# Patient Record
Sex: Male | Born: 1975 | Race: White | Hispanic: No | Marital: Married | Smoking: Never smoker
Health system: Southern US, Community
[De-identification: ages and names within clinical notes are randomized; demographics above are authoritative.]

## PROBLEM LIST (undated history)

## (undated) DIAGNOSIS — S069X9A Unspecified intracranial injury with loss of consciousness of unspecified duration, initial encounter: Secondary | ICD-10-CM

## (undated) DIAGNOSIS — C801 Malignant (primary) neoplasm, unspecified: Secondary | ICD-10-CM

## (undated) DIAGNOSIS — H9190 Unspecified hearing loss, unspecified ear: Secondary | ICD-10-CM

## (undated) DIAGNOSIS — S069XAA Unspecified intracranial injury with loss of consciousness status unknown, initial encounter: Secondary | ICD-10-CM

---

## 2001-08-14 ENCOUNTER — Ambulatory Visit (HOSPITAL_COMMUNITY): Admission: RE | Admit: 2001-08-14 | Discharge: 2001-08-14 | Payer: Self-pay | Admitting: Orthopedic Surgery

## 2001-08-14 ENCOUNTER — Encounter: Payer: Self-pay | Admitting: Orthopedic Surgery

## 2005-02-27 ENCOUNTER — Emergency Department (HOSPITAL_COMMUNITY): Admission: EM | Admit: 2005-02-27 | Discharge: 2005-02-27 | Payer: Self-pay | Admitting: Emergency Medicine

## 2009-09-09 ENCOUNTER — Encounter: Admission: RE | Admit: 2009-09-09 | Discharge: 2009-09-09 | Payer: Self-pay | Admitting: Family Medicine

## 2011-05-22 ENCOUNTER — Other Ambulatory Visit: Payer: Self-pay | Admitting: Occupational Medicine

## 2011-05-22 ENCOUNTER — Ambulatory Visit
Admission: RE | Admit: 2011-05-22 | Discharge: 2011-05-22 | Disposition: A | Discharge status: Home / Self Care | Payer: Worker's Compensation | Source: Ambulatory Visit | Attending: Occupational Medicine | Admitting: Occupational Medicine

## 2011-05-22 DIAGNOSIS — T148XXA Other injury of unspecified body region, initial encounter: Secondary | ICD-10-CM

## 2012-02-13 ENCOUNTER — Ambulatory Visit
Admission: RE | Admit: 2012-02-13 | Discharge: 2012-02-13 | Disposition: A | Discharge status: Home / Self Care | Payer: 59 | Source: Ambulatory Visit | Attending: Family Medicine | Admitting: Family Medicine

## 2012-02-13 ENCOUNTER — Other Ambulatory Visit: Payer: Self-pay | Admitting: Family Medicine

## 2012-02-13 DIAGNOSIS — R52 Pain, unspecified: Secondary | ICD-10-CM

## 2013-10-18 ENCOUNTER — Encounter (HOSPITAL_COMMUNITY): Payer: Self-pay | Admitting: Emergency Medicine

## 2013-10-18 ENCOUNTER — Emergency Department (HOSPITAL_COMMUNITY)
Admission: EM | Admit: 2013-10-18 | Discharge: 2013-10-19 | Disposition: A | Discharge status: Home / Self Care | Payer: 59 | Attending: Emergency Medicine | Admitting: Emergency Medicine

## 2013-10-18 ENCOUNTER — Emergency Department (HOSPITAL_COMMUNITY): Payer: 59

## 2013-10-18 DIAGNOSIS — W540XXA Bitten by dog, initial encounter: Secondary | ICD-10-CM | POA: Insufficient documentation

## 2013-10-18 DIAGNOSIS — S61409A Unspecified open wound of unspecified hand, initial encounter: Secondary | ICD-10-CM | POA: Insufficient documentation

## 2013-10-18 DIAGNOSIS — Z8669 Personal history of other diseases of the nervous system and sense organs: Secondary | ICD-10-CM | POA: Insufficient documentation

## 2013-10-18 DIAGNOSIS — Y9389 Activity, other specified: Secondary | ICD-10-CM | POA: Insufficient documentation

## 2013-10-18 DIAGNOSIS — S51809A Unspecified open wound of unspecified forearm, initial encounter: Secondary | ICD-10-CM | POA: Insufficient documentation

## 2013-10-18 DIAGNOSIS — Y92009 Unspecified place in unspecified non-institutional (private) residence as the place of occurrence of the external cause: Secondary | ICD-10-CM | POA: Insufficient documentation

## 2013-10-18 HISTORY — DX: Unspecified hearing loss, unspecified ear: H91.90

## 2013-10-18 MED ORDER — FENTANYL CITRATE 0.05 MG/ML IJ SOLN
50.0000 ug | Freq: Once | INTRAMUSCULAR | Status: AC
  Administered 2013-10-18: 50 ug via INTRAVENOUS
  Filled 2013-10-18: qty 2
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
## 2013-10-18 NOTE — ED Notes (Signed)
Pt soaking hands and wrist at this time.  Family at bedside.  Family states they asked dog owners about shots and they were told shots were up to date on dog.  Dog was a great dane.

## 2013-10-18 NOTE — ED Notes (Signed)
Patient transported to X-ray 
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
## 2013-10-19 MED ORDER — ONDANSETRON HCL 4 MG/2ML IJ SOLN
4.0000 mg | Freq: Once | INTRAMUSCULAR | Status: AC
  Administered 2013-10-19: 4 mg via INTRAVENOUS
  Filled 2013-10-19: qty 2

## 2013-10-19 MED ORDER — AMOXICILLIN-POT CLAVULANATE 875-125 MG PO TABS: 1.0000 | ORAL_TABLET | Freq: Two times a day (BID) | ORAL | Status: DC

## 2013-10-19 MED ORDER — ONDANSETRON 8 MG PO TBDP: ORAL_TABLET | ORAL | Status: DC

## 2013-10-19 MED ORDER — HYDROCODONE-ACETAMINOPHEN 5-325 MG PO TABS: 1.0000 | ORAL_TABLET | ORAL | Status: DC | PRN

## 2013-10-19 NOTE — ED Notes (Signed)
Non-adherent dressings and gauze wrapping placed on pt after PA cleaned arms with iodine soap.

## 2013-10-19 NOTE — ED Provider Notes (Signed)
Medical screening examination/treatment/procedure(s) were performed by non-physician practitioner and as supervising physician I was immediately available for consultation/collaboration.   Dot Lanes, MD 10/19/13 1247

## 2013-10-19 NOTE — Discharge Instructions (Signed)
1. Medications: vicodin for pain, augmentin is your antibiotic, zofran for nausea, usual home medications 2. Treatment: rest, drink plenty of fluids, keep wound clean with warm soap and water; keep bandages dry 3. Follow Up: Please followup with Dr. Burney Gauze sometime next week for wound check and re-evaluation   Animal Bite An animal bite can result in a scratch on the skin, deep open cut, puncture of the skin, crush injury, or tearing away of the skin or a body part. Dogs are responsible for most animal bites. Children are bitten more often than adults. An animal bite can range from very mild to more serious. A small bite from your house pet is no cause for alarm. However, some animal bites can become infected or injure a bone or other tissue. You must seek medical care if:  The skin is broken and bleeding does not slow down or stop after 15 minutes.  The puncture is deep and difficult to clean (such as a cat bite).  Pain, warmth, redness, or pus develops around the wound.  The bite is from a stray animal or rodent. There may be a risk of rabies infection.  The bite is from a snake, raccoon, skunk, fox, coyote, or bat. There may be a risk of rabies infection.  The person bitten has a chronic illness such as diabetes, liver disease, or cancer, or the person takes medicine that lowers the immune system.  There is concern about the location and severity of the bite. It is important to clean and protect an animal bite wound right away to prevent infection. Follow these steps:  Clean the wound with plenty of water and soap.  Apply an antibiotic cream.  Apply gentle pressure over the wound with a clean towel or gauze to slow or stop bleeding.  Elevate the affected area above the heart to help stop any bleeding.  Seek medical care. Getting medical care within 8 hours of the animal bite leads to the best possible outcome. DIAGNOSIS  Your caregiver will most likely:  Take a detailed  history of the animal and the bite injury.  Perform a wound exam.  Take your medical history. Blood tests or X-rays may be performed. Sometimes, infected bite wounds are cultured and sent to a lab to identify the infectious bacteria.  TREATMENT  Medical treatment will depend on the location and type of animal bite as well as the patient's medical history. Treatment may include:  Wound care, such as cleaning and flushing the wound with saline solution, bandaging, and elevating the affected area.  Antibiotics.  Tetanus immunization.  Rabies immunization.  Leaving the wound open to heal. This is often done with animal bites, due to the high risk of infection. However, in certain cases, wound closure with stitches, wound adhesive, skin adhesive strips, or staples may be used. Infected bites that are left untreated may require intravenous (IV) antibiotics and surgical treatment in the hospital. [place]  Follow your caregiver's instructions for wound care.  Take all medicines as directed.  If your caregiver prescribes antibiotics, take them as directed. Finish them even if you start to feel better.  Follow up with your caregiver for further exams or immunizations as directed. You may need a tetanus shot if:  You cannot remember when you had your last tetanus shot.  You have never had a tetanus shot.  The injury broke your skin. If you get a tetanus shot, your arm may swell, get red, and feel warm to the  touch. This is common and not a problem. If you need a tetanus shot and you choose not to have one, there is a rare chance of getting tetanus. Sickness from tetanus can be serious. SEEK MEDICAL CARE IF:  You notice warmth, redness, soreness, swelling, pus discharge, or a bad smell coming from the wound.  You have a red line on the skin coming from the wound.  You have a fever, chills, or a general ill feeling.  You have nausea or vomiting.  You have continued or  worsening pain.  You have trouble moving the injured part.  You have other questions or concerns. MAKE SURE YOU:  Understand these instructions.  Will watch your condition.  Will get help right away if you are not doing well or get worse. Document Released: 03/28/2011 Document Revised: 10/02/2011 Document Reviewed: 03/28/2011 [facility] Patient Information 2014 [place].

## 2014-07-24 DIAGNOSIS — C801 Malignant (primary) neoplasm, unspecified: Secondary | ICD-10-CM

## 2014-07-24 HISTORY — DX: Malignant (primary) neoplasm, unspecified: C80.1

## 2014-07-24 HISTORY — PX: TUMOR REMOVAL: SHX12

## 2014-10-15 ENCOUNTER — Encounter (HOSPITAL_COMMUNITY): Payer: Self-pay | Admitting: Emergency Medicine

## 2014-10-15 ENCOUNTER — Emergency Department (HOSPITAL_COMMUNITY)
Admission: EM | Admit: 2014-10-15 | Discharge: 2014-10-15 | Disposition: A | Discharge status: Home / Self Care | Payer: 59 | Attending: Emergency Medicine | Admitting: Emergency Medicine

## 2014-10-15 ENCOUNTER — Emergency Department (HOSPITAL_COMMUNITY): Payer: 59

## 2014-10-15 DIAGNOSIS — Z79899 Other long term (current) drug therapy: Secondary | ICD-10-CM | POA: Diagnosis not present

## 2014-10-15 DIAGNOSIS — N12 Tubulo-interstitial nephritis, not specified as acute or chronic: Secondary | ICD-10-CM | POA: Diagnosis not present

## 2014-10-15 DIAGNOSIS — H9190 Unspecified hearing loss, unspecified ear: Secondary | ICD-10-CM | POA: Diagnosis not present

## 2014-10-15 DIAGNOSIS — R5383 Other fatigue: Secondary | ICD-10-CM | POA: Insufficient documentation

## 2014-10-15 DIAGNOSIS — M549 Dorsalgia, unspecified: Secondary | ICD-10-CM | POA: Diagnosis present

## 2014-10-15 LAB — URINE MICROSCOPIC-ADD ON

## 2014-10-15 LAB — CBC WITH DIFFERENTIAL/PLATELET
BASOS ABS: 0 10*3/uL (ref 0.0–0.1)
BASOS PCT: 0 % (ref 0–1)
EOS PCT: 0 % (ref 0–5)
Eosinophils Absolute: 0 10*3/uL (ref 0.0–0.7)
HCT: 41.1 % (ref 39.0–52.0)
Hemoglobin: 13.9 g/dL (ref 13.0–17.0)
LYMPHS ABS: 0.5 10*3/uL — AB (ref 0.7–4.0)
Lymphocytes Relative: 4 % — ABNORMAL LOW (ref 12–46)
MCH: 33.5 pg (ref 26.0–34.0)
MCHC: 33.8 g/dL (ref 30.0–36.0)
MCV: 99 fL (ref 78.0–100.0)
MONO ABS: 1.3 10*3/uL — AB (ref 0.1–1.0)
MONOS PCT: 13 % — AB (ref 3–12)
NEUTROS PCT: 83 % — AB (ref 43–77)
Neutro Abs: 8.4 10*3/uL — ABNORMAL HIGH (ref 1.7–7.7)
PLATELETS: 69 10*3/uL — AB (ref 150–400)
RBC: 4.15 MIL/uL — ABNORMAL LOW (ref 4.22–5.81)
RDW: 12 % (ref 11.5–15.5)
WBC: 10.1 10*3/uL (ref 4.0–10.5)

## 2014-10-15 LAB — COMPREHENSIVE METABOLIC PANEL
ALBUMIN: 3.4 g/dL — AB (ref 3.5–5.2)
ALK PHOS: 54 U/L (ref 39–117)
ALT: 26 U/L (ref 0–53)
ANION GAP: 11 (ref 5–15)
AST: 34 U/L (ref 0–37)
BILIRUBIN TOTAL: 0.6 mg/dL (ref 0.3–1.2)
BUN: 27 mg/dL — AB (ref 6–23)
CALCIUM: 8.7 mg/dL (ref 8.4–10.5)
CHLORIDE: 96 mmol/L (ref 96–112)
CO2: 27 mmol/L (ref 19–32)
CREATININE: 2.03 mg/dL — AB (ref 0.50–1.35)
GFR calc Af Amer: 47 mL/min — ABNORMAL LOW (ref >90)
GFR calc non Af Amer: 40 mL/min — ABNORMAL LOW (ref >90)
Glucose, Bld: 151 mg/dL — ABNORMAL HIGH (ref 70–99)
Potassium: 3.6 mmol/L (ref 3.5–5.1)
Sodium: 134 mmol/L — ABNORMAL LOW (ref 135–145)
Total Protein: 7.2 g/dL (ref 6.0–8.3)

## 2014-10-15 LAB — URINALYSIS, ROUTINE W REFLEX MICROSCOPIC
Bilirubin Urine: NEGATIVE
Glucose, UA: NEGATIVE mg/dL
Ketones, ur: NEGATIVE mg/dL
Nitrite: NEGATIVE
Protein, ur: 100 mg/dL — AB
Specific Gravity, Urine: 1.019 (ref 1.005–1.030)
Urobilinogen, UA: 1 mg/dL (ref 0.0–1.0)
pH: 6 (ref 5.0–8.0)

## 2014-10-15 LAB — I-STAT CG4 LACTIC ACID, ED: LACTIC ACID, VENOUS: 1.59 mmol/L (ref 0.5–2.0)

## 2014-10-15 MED ORDER — CIPROFLOXACIN HCL 500 MG PO TABS: 500.0000 mg | ORAL_TABLET | Freq: Two times a day (BID) | ORAL | Status: DC

## 2014-10-15 MED ORDER — OXYCODONE-ACETAMINOPHEN 5-325 MG PO TABS
2.0000 | ORAL_TABLET | Freq: Once | ORAL | Status: AC
  Administered 2014-10-15: 2 via ORAL
  Filled 2014-10-15: qty 2

## 2014-10-15 MED ORDER — DEXTROSE 5 % IV SOLN
1.0000 g | Freq: Once | INTRAVENOUS | Status: AC
  Administered 2014-10-15: 1 g via INTRAVENOUS
  Filled 2014-10-15: qty 10

## 2014-10-15 MED ORDER — ACETAMINOPHEN 325 MG PO TABS
650.0000 mg | ORAL_TABLET | Freq: Four times a day (QID) | ORAL | Status: DC | PRN
  Administered 2014-10-15: 650 mg via ORAL
  Filled 2014-10-15: qty 2

## 2014-10-15 NOTE — ED Notes (Signed)
Post void residual-629cc. MD made aware

## 2014-10-15 NOTE — ED Notes (Signed)
Leg bag applied to patient, foley care and education completed with wife at bedside, follow up with urology reviewed with PA at bedside.  Verbalized understanding.

## 2014-10-15 NOTE — ED Notes (Signed)
From PCP, fever, confusion, HA since mon, no V/D, also c/o cough, fever, tachy in triage, abd pain, difficulty walking

## 2014-10-15 NOTE — Discharge Instructions (Signed)
Urinary Tract Infection Urinary tract infections (UTIs) can develop anywhere along your urinary tract. Your urinary tract is your body's drainage system for removing wastes and extra water. Your urinary tract includes two kidneys, two ureters, a bladder, and a urethra. Your kidneys are a pair of bean-shaped organs. Each kidney is about the size of your fist. They are located below your ribs, one on each side of your spine. CAUSES Infections are caused by microbes, which are microscopic organisms, including fungi, viruses, and bacteria. These organisms are so small that they can only be seen through a microscope. Bacteria are the microbes that most commonly cause UTIs. SYMPTOMS  Symptoms of UTIs may vary by age and gender of the patient and by the location of the infection. Symptoms in young women typically include a frequent and intense urge to urinate and a painful, burning feeling in the bladder or urethra during urination. Older women and men are more likely to be tired, shaky, and weak and have muscle aches and abdominal pain. A fever may mean the infection is in your kidneys. Other symptoms of a kidney infection include pain in your back or sides below the ribs, nausea, and vomiting. DIAGNOSIS To diagnose a UTI, your caregiver will ask you about your symptoms. Your caregiver also will ask to provide a urine sample. The urine sample will be tested for bacteria and white blood cells. White blood cells are made by your body to help fight infection. TREATMENT  Typically, UTIs can be treated with medication. Because most UTIs are caused by a bacterial infection, they usually can be treated with the use of antibiotics. The choice of antibiotic and length of treatment depend on your symptoms and the type of bacteria causing your infection. HOME CARE INSTRUCTIONS  If you were prescribed antibiotics, take them exactly as your caregiver instructs you. Finish the medication even if you feel better after you  have only taken some of the medication.  Drink enough water and fluids to keep your urine clear or pale yellow.  Avoid caffeine, tea, and carbonated beverages. They tend to irritate your bladder.  Empty your bladder often. Avoid holding urine for long periods of time.  Empty your bladder before and after sexual intercourse.  After a bowel movement, women should cleanse from front to back. Use each tissue only once. SEEK MEDICAL CARE IF:   You have back pain.  You develop a fever.  Your symptoms do not begin to resolve within 3 days. SEEK IMMEDIATE MEDICAL CARE IF:   You have severe back pain or lower abdominal pain.  You develop chills.  You have nausea or vomiting.  You have continued burning or discomfort with urination. MAKE SURE YOU:   Understand these instructions.  Will watch your condition.  Will get help right away if you are not doing well or get worse. Document Released: 04/19/2005 Document Revised: 01/09/2012 Document Reviewed: 08/18/2011 [facility] Patient Information 2015 [place], [place]. This information is not intended to replace advice given to you by your health care provider. Make sure you discuss any questions you have with your health care provider.  Please take medication as directed for the full course of therapy. Please follow-up with [facility] tomorrow for further evaluation and management. Please inform them of your emergency room visit and THERAPIES received here. If symptoms worsen including increased fever, fever uncontrolled by medication, nausea, vomiting, increased pain, dizziness, change in urine, or any other concerning signs or symptoms please return immediately to the emergency room  for further evaluation and management.

## 2014-10-15 NOTE — ED Provider Notes (Signed)
CSN: [id number]     Arrival date & time 10/15/14  1200 History   First MD Initiated Contact with Patient 10/15/14 1305     Chief Complaint  Patient presents with  . Fever  . Headache    HPI  39 year old male presents today with 4 days of fever, fatigue, back pain. She reports he was seen by his primary care provider today at [facility] who requested he be seen in the emergency room for potential urinary tract infection. Patient reports that he was seen in February for urinary retention and found to have an enlarged prostate. He was not treated for prostatitis at that time and was scheduled a follow-up appointment with urologist in April. Given doxazosin reports he has not been taking it. He reports frequent urinations, little production, suprapubic fullness. He denies blood, discharge, painful urinations. He reports nausea, no vomiting. Mild frontal headache from time to time, no associated neuro symptoms. Patient reports he's been treating his fever with Tylenol, MAXIMUM TEMPERATURE 104.1. Patient denies neck stiffness, chest pain, shortness of breath, vomiting, lower extremity swelling. Patient reports his urological follow-up is April 1 with [facility] with Dr. Tresa Moore.    Past Medical History  Diagnosis Date  . Hard of hearing    History reviewed. No pertinent past surgical history. No family history on file. History  Substance Use Topics  . Smoking status: Never Smoker   . Smokeless tobacco: Not on file  . Alcohol Use: No    Review of Systems  All other systems reviewed and are negative.  Allergies  Phenobarbital and Fish allergy  Home Medications   Prior to Admission medications   Medication Sig Start Date End Date Taking? Authorizing Provider  amoxicillin-clavulanate (AUGMENTIN) 875-125 MG per tablet Take 1 tablet by mouth 2 (two) times daily. One po bid x 7 days 10/19/13   Jarrett Soho Muthersbaugh, PA-C  HYDROcodone-acetaminophen (NORCO/VICODIN) 5-325 MG per tablet  Take 1 tablet by mouth every 4 (four) hours as needed. 10/19/13   Hannah Muthersbaugh, PA-C  ondansetron (ZOFRAN ODT) 8 MG disintegrating tablet 8mg  ODT q4 hours prn nausea 10/19/13   Hannah Muthersbaugh, PA-C   BP 110/68 mmHg  Pulse 78  Temp(Src) 100.1 F (37.8 C) (Oral)  Resp 16  Ht 6\' 3"  (1.905 m)  Wt 181 lb (82.101 kg)  BMI 22.62 kg/m2  SpO2 98% Physical Exam  Constitutional: He is oriented to person, place, and time. He appears well-developed and well-nourished.  HENT:  Head: Normocephalic and atraumatic.  Eyes: Pupils are equal, round, and reactive to light.  Neck: Normal range of motion. Neck supple. No JVD present. No tracheal deviation present. No thyromegaly present.  Cardiovascular: Normal rate, regular rhythm, normal heart sounds and intact distal pulses.  Exam reveals no gallop and no friction rub.   No murmur heard. Pulmonary/Chest: Effort normal and breath sounds normal. No stridor. No respiratory distress. He has no wheezes. He has no rales. He exhibits no tenderness.  Abdominal: Soft. Bowel sounds are normal. He exhibits no distension and no mass. There is CVA tenderness. There is no rebound and no guarding.  Suprapubic tenderness, right CVA tenderness  Musculoskeletal: Normal range of motion.  Lymphadenopathy:    He has no cervical adenopathy.  Neurological: He is alert and oriented to person, place, and time. Coordination normal.  Skin: Skin is warm and dry.  Psychiatric: He has a normal mood and affect. His behavior is normal. Judgment and thought content normal.  Nursing note and vitals reviewed.  ED Course  Procedures (including critical care time) Labs Review Labs Reviewed  CBC WITH DIFFERENTIAL/PLATELET - Abnormal; Notable for the following:    RBC 4.15 (*)    Platelets 69 (*)    Neutrophils Relative % 83 (*)    Neutro Abs 8.4 (*)    Lymphocytes Relative 4 (*)    Lymphs Abs 0.5 (*)    Monocytes Relative 13 (*)    Monocytes Absolute 1.3 (*)    All  other components within normal limits  COMPREHENSIVE METABOLIC PANEL - Abnormal; Notable for the following:    Sodium 134 (*)    Glucose, Bld 151 (*)    BUN 27 (*)    Creatinine, Ser 2.03 (*)    Albumin 3.4 (*)    GFR calc non Af Amer 40 (*)    GFR calc Af Amer 47 (*)    All other components within normal limits  URINALYSIS, ROUTINE W REFLEX MICROSCOPIC - Abnormal; Notable for the following:    APPearance TURBID (*)    Hgb urine dipstick LARGE (*)    Protein, ur 100 (*)    Leukocytes, UA LARGE (*)    All other components within normal limits  URINE MICROSCOPIC-ADD ON - Abnormal; Notable for the following:    Squamous Epithelial / LPF FEW (*)    Bacteria, UA MANY (*)    Casts GRANULAR CAST (*)    All other components within normal limits  I-STAT CG4 LACTIC ACID, ED    Imaging Review Dg Chest 2 View  10/15/2014   CLINICAL DATA:  Fever, headache cough, dizziness  EXAM: CHEST  2 VIEW  COMPARISON:  None.  FINDINGS: Cardiomediastinal silhouette is unremarkable. No acute infiltrate or pleural effusion. No pulmonary edema. Bony thorax is unremarkable.  IMPRESSION: No active cardiopulmonary disease.   Electronically Signed   By: Lahoma Crocker M.D.   On: 10/15/2014 13:50     EKG Interpretation None      MDM   Final diagnoses:  Pyelonephritis    Labs: Hgb large, protein 100, leukocytes large, many bacteria, granular casts; CBC platelets 69; CMP creatinine 2.03 BUN 27 GFR 40  Consults: Urology Herbert Moors MD  Therapeutics: Post void residual 629 mL, Tylenol, Percocet  Assessment/Plan: Patient's presentation most likely pyelonephritis with the possibility of prostatitis. Patient is stable with a temperature of 98.9, pulse of 89 and blood pressure 127/76. Patient reports improvement of symptoms with catheter placement, continues to report right flank pain. Patient's case was discussed with Herbert Moors M.D. Urology. He recommended in-hospital dose of antibiotics followed by  outpatient oral therapy; if patient agreed to outpatient treatment. Instructed for patient to contact clinic for follow-up instructions. Discussion with patient, wife, mother discussing benefits and risks of inpatient his outpatient treatment. All 3 agreed that he would have close supervision, with close follow-up. Patient was counseled on warning signs including increased fever, nausea, vomiting, abdominal pain, change in urinary output, weakness, or any other concerning signs or symptoms. He agreed to return immediately if any of the above present. He agreed to follow-up tomorrow with urology patient was discharged home with instructions to use Tylenol as needed for fever pain, drink plenty fluids, and rest. Catheter was left in place due to patient's urinary retention, he was given care instructions for the catheter.     Okey Regal, PA-C 10/15/14 1828  Malvin Johns, MD 10/16/14 [phone]

## 2014-10-21 ENCOUNTER — Inpatient Hospital Stay (HOSPITAL_COMMUNITY)
Admission: EM | Admit: 2014-10-21 | Discharge: 2014-10-26 | DRG: 690 | Disposition: A | Discharge status: Home Health | Payer: 59 | Attending: Internal Medicine | Admitting: Internal Medicine

## 2014-10-21 ENCOUNTER — Encounter (HOSPITAL_COMMUNITY): Payer: Self-pay | Admitting: Family Medicine

## 2014-10-21 ENCOUNTER — Emergency Department (HOSPITAL_COMMUNITY): Payer: 59

## 2014-10-21 DIAGNOSIS — Z888 Allergy status to other drugs, medicaments and biological substances status: Secondary | ICD-10-CM

## 2014-10-21 DIAGNOSIS — N12 Tubulo-interstitial nephritis, not specified as acute or chronic: Secondary | ICD-10-CM | POA: Diagnosis present

## 2014-10-21 DIAGNOSIS — Z79899 Other long term (current) drug therapy: Secondary | ICD-10-CM | POA: Diagnosis not present

## 2014-10-21 DIAGNOSIS — Z91013 Allergy to seafood: Secondary | ICD-10-CM | POA: Diagnosis not present

## 2014-10-21 DIAGNOSIS — D432 Neoplasm of uncertain behavior of brain, unspecified: Secondary | ICD-10-CM | POA: Diagnosis not present

## 2014-10-21 DIAGNOSIS — H903 Sensorineural hearing loss, bilateral: Secondary | ICD-10-CM | POA: Diagnosis present

## 2014-10-21 DIAGNOSIS — N179 Acute kidney failure, unspecified: Secondary | ICD-10-CM | POA: Diagnosis present

## 2014-10-21 DIAGNOSIS — N319 Neuromuscular dysfunction of bladder, unspecified: Secondary | ICD-10-CM | POA: Diagnosis present

## 2014-10-21 DIAGNOSIS — R339 Retention of urine, unspecified: Secondary | ICD-10-CM | POA: Diagnosis present

## 2014-10-21 DIAGNOSIS — R05 Cough: Secondary | ICD-10-CM | POA: Diagnosis present

## 2014-10-21 DIAGNOSIS — N1330 Unspecified hydronephrosis: Secondary | ICD-10-CM | POA: Diagnosis not present

## 2014-10-21 DIAGNOSIS — K219 Gastro-esophageal reflux disease without esophagitis: Secondary | ICD-10-CM | POA: Diagnosis present

## 2014-10-21 DIAGNOSIS — A419 Sepsis, unspecified organism: Secondary | ICD-10-CM | POA: Diagnosis present

## 2014-10-21 DIAGNOSIS — J634 Siderosis: Secondary | ICD-10-CM | POA: Diagnosis not present

## 2014-10-21 DIAGNOSIS — N151 Renal and perinephric abscess: Principal | ICD-10-CM | POA: Diagnosis present

## 2014-10-21 DIAGNOSIS — N131 Hydronephrosis with ureteral stricture, not elsewhere classified: Secondary | ICD-10-CM | POA: Diagnosis not present

## 2014-10-21 DIAGNOSIS — N136 Pyonephrosis: Secondary | ICD-10-CM | POA: Diagnosis present

## 2014-10-21 DIAGNOSIS — R059 Cough, unspecified: Secondary | ICD-10-CM

## 2014-10-21 HISTORY — DX: Unspecified intracranial injury with loss of consciousness of unspecified duration, initial encounter: S06.9X9A

## 2014-10-21 HISTORY — DX: Unspecified intracranial injury with loss of consciousness status unknown, initial encounter: S06.9XAA

## 2014-10-21 LAB — COMPREHENSIVE METABOLIC PANEL
ALBUMIN: 2.8 g/dL — AB (ref 3.5–5.2)
ALK PHOS: 63 U/L (ref 39–117)
ALT: 26 U/L (ref 0–53)
ANION GAP: 11 (ref 5–15)
AST: 26 U/L (ref 0–37)
BUN: 11 mg/dL (ref 6–23)
CHLORIDE: 102 mmol/L (ref 96–112)
CO2: 26 mmol/L (ref 19–32)
Calcium: 8.8 mg/dL (ref 8.4–10.5)
Creatinine, Ser: 1.2 mg/dL (ref 0.50–1.35)
GFR calc Af Amer: 89 mL/min — ABNORMAL LOW (ref >90)
GFR calc non Af Amer: 76 mL/min — ABNORMAL LOW (ref >90)
Glucose, Bld: 123 mg/dL — ABNORMAL HIGH (ref 70–99)
POTASSIUM: 4.1 mmol/L (ref 3.5–5.1)
SODIUM: 139 mmol/L (ref 135–145)
Total Bilirubin: 0.7 mg/dL (ref 0.3–1.2)
Total Protein: 6.7 g/dL (ref 6.0–8.3)

## 2014-10-21 LAB — CBC WITH DIFFERENTIAL/PLATELET
Basophils Absolute: 0 10*3/uL (ref 0.0–0.1)
Basophils Relative: 0 % (ref 0–1)
EOS ABS: 0.2 10*3/uL (ref 0.0–0.7)
Eosinophils Relative: 2 % (ref 0–5)
HCT: 36.6 % — ABNORMAL LOW (ref 39.0–52.0)
HEMOGLOBIN: 11.7 g/dL — AB (ref 13.0–17.0)
LYMPHS ABS: 1.3 10*3/uL (ref 0.7–4.0)
Lymphocytes Relative: 13 % (ref 12–46)
MCH: 32.3 pg (ref 26.0–34.0)
MCHC: 32 g/dL (ref 30.0–36.0)
MCV: 101.1 fL — ABNORMAL HIGH (ref 78.0–100.0)
MONOS PCT: 9 % (ref 3–12)
Monocytes Absolute: 0.9 10*3/uL (ref 0.1–1.0)
NEUTROS ABS: 7.5 10*3/uL (ref 1.7–7.7)
NEUTROS PCT: 76 % (ref 43–77)
Platelets: 234 10*3/uL (ref 150–400)
RBC: 3.62 MIL/uL — AB (ref 4.22–5.81)
RDW: 13.2 % (ref 11.5–15.5)
WBC: 9.9 10*3/uL (ref 4.0–10.5)

## 2014-10-21 LAB — URINALYSIS, ROUTINE W REFLEX MICROSCOPIC
BILIRUBIN URINE: NEGATIVE
Glucose, UA: NEGATIVE mg/dL
Ketones, ur: NEGATIVE mg/dL
NITRITE: NEGATIVE
Protein, ur: NEGATIVE mg/dL
SPECIFIC GRAVITY, URINE: 1.01 (ref 1.005–1.030)
Urobilinogen, UA: 1 mg/dL (ref 0.0–1.0)
pH: 6.5 (ref 5.0–8.0)

## 2014-10-21 LAB — URINE MICROSCOPIC-ADD ON

## 2014-10-21 LAB — LIPASE, BLOOD: LIPASE: 34 U/L (ref 11–59)

## 2014-10-21 MED ORDER — SODIUM CHLORIDE 0.9 % IV SOLN: INTRAVENOUS | Status: DC

## 2014-10-21 MED ORDER — IOHEXOL 300 MG/ML  SOLN
80.0000 mL | Freq: Once | INTRAMUSCULAR | Status: AC | PRN
  Administered 2014-10-21: 80 mL via INTRAVENOUS

## 2014-10-21 MED ORDER — HYDROMORPHONE HCL 1 MG/ML IJ SOLN: 0.5000 mg | INTRAMUSCULAR | Status: DC | PRN

## 2014-10-21 MED ORDER — ENOXAPARIN SODIUM 40 MG/0.4ML ~~LOC~~ SOLN
40.0000 mg | SUBCUTANEOUS | Status: DC
  Administered 2014-10-22 – 2014-10-26 (×5): 40 mg via SUBCUTANEOUS
  Filled 2014-10-21 (×5): qty 0.4

## 2014-10-21 MED ORDER — DEXTROSE 5 % IV SOLN
1.0000 g | Freq: Once | INTRAVENOUS | Status: AC
  Administered 2014-10-21: 1 g via INTRAVENOUS
  Filled 2014-10-21: qty 10

## 2014-10-21 MED ORDER — METRONIDAZOLE IN NACL 5-0.79 MG/ML-% IV SOLN
500.0000 mg | Freq: Once | INTRAVENOUS | Status: AC
  Administered 2014-10-22: 500 mg via INTRAVENOUS
  Filled 2014-10-21 (×2): qty 100

## 2014-10-21 MED ORDER — DEXTROSE 5 % IV SOLN
1.0000 g | INTRAVENOUS | Status: DC
  Administered 2014-10-22 – 2014-10-25 (×4): 1 g via INTRAVENOUS
  Filled 2014-10-21 (×5): qty 10

## 2014-10-21 MED ORDER — ONDANSETRON HCL 4 MG PO TABS: 4.0000 mg | ORAL_TABLET | Freq: Four times a day (QID) | ORAL | Status: DC | PRN

## 2014-10-21 MED ORDER — ACETAMINOPHEN 650 MG RE SUPP
650.0000 mg | Freq: Four times a day (QID) | RECTAL | Status: DC | PRN
Start: 2014-10-21 — End: 2014-10-26

## 2014-10-21 MED ORDER — ACETAMINOPHEN 325 MG PO TABS: 650.0000 mg | ORAL_TABLET | Freq: Four times a day (QID) | ORAL | Status: DC | PRN

## 2014-10-21 MED ORDER — OXYCODONE HCL 5 MG PO TABS: 5.0000 mg | ORAL_TABLET | ORAL | Status: DC | PRN

## 2014-10-21 MED ORDER — IOHEXOL 300 MG/ML  SOLN
25.0000 mL | INTRAMUSCULAR | Status: AC
  Administered 2014-10-21 (×2): 25 mL via ORAL

## 2014-10-21 MED ORDER — ALUM & MAG HYDROXIDE-SIMETH 200-200-20 MG/5ML PO SUSP: 30.0000 mL | Freq: Four times a day (QID) | ORAL | Status: DC | PRN

## 2014-10-21 MED ORDER — VANCOMYCIN HCL IN DEXTROSE 1-5 GM/200ML-% IV SOLN: 1000.0000 mg | Freq: Once | INTRAVENOUS | Status: DC

## 2014-10-21 MED ORDER — VANCOMYCIN HCL 10 G IV SOLR
1500.0000 mg | Freq: Two times a day (BID) | INTRAVENOUS | Status: DC
  Administered 2014-10-22 – 2014-10-23 (×3): 1500 mg via INTRAVENOUS
  Filled 2014-10-21 (×4): qty 1500

## 2014-10-21 MED ORDER — METRONIDAZOLE IN NACL 5-0.79 MG/ML-% IV SOLN
500.0000 mg | Freq: Three times a day (TID) | INTRAVENOUS | Status: DC
  Administered 2014-10-22 – 2014-10-23 (×2): 500 mg via INTRAVENOUS
  Filled 2014-10-21 (×5): qty 100

## 2014-10-21 MED ORDER — ONDANSETRON HCL 4 MG/2ML IJ SOLN: 4.0000 mg | Freq: Four times a day (QID) | INTRAMUSCULAR | Status: DC | PRN

## 2014-10-21 NOTE — ED Notes (Signed)
Rocephin not actually started before blood cultures.

## 2014-10-21 NOTE — H&P (Signed)
[facility] Admission History and Physical       Andrew Higgins IRS:[id number] DOB: 1975/12/30 DOA: 10/21/2014  Referring physician: EDP PCP: [name]  Specialists:   Chief Complaint: Fevers and Chills  HPI: Andrew Higgins is a 39 y.o. male with No Significant Medical history who presents to the ED with complaints of persistent fevers and chills despite therapy with Ciprofloxacin for a UTI for 7 days.  He reports that his symptoms began in late February and he had Left Flank Pain and had problems with his urinary stream. His PCP placed on Doxazosin  And made a referral to Urology for April .   He had increased problems with dysuria and urinary retention as well and went to the ED 1 week ago.    He was evaluated and a foley cahteter was placed  In the ED, and then he was seen by Urology Dr. Tresa Moore 1 day ago and had a Cystoscopy performed in the office and his foley catheter was removed.   He came to the ED the next day due to fevers to 103.5 at home, and chills, and dysuria, and urinary retention.   Blood and Urine Cultures were ordered, and he was placed on IV Vancomycin , Rocephin and Metronidazole, and Urology ws consulted.     Review of Systems:  Constitutional: No Weight Loss, No Weight Gain, Night Sweats, +Fevers,+ Chills, Dizziness, Light Headedness, Fatigue, or Generalized Weakness HEENT: No Headaches, Difficulty Swallowing,Tooth/Dental Problems,Sore Throat,  No Sneezing, Rhinitis, Ear Ache, Nasal Congestion, or Post Nasal Drip,  Cardio-vascular:  No Chest pain, Orthopnea, PND, Edema in Lower Extremities, Anasarca, Dizziness, Palpitations  Resp: No Dyspnea, No DOE, No Productive Cough, No Non-Productive Cough, No Hemoptysis, No Wheezing.    GI: No Heartburn, Indigestion, Abdominal Pain, Nausea, Vomiting, Diarrhea, Constipation, Hematemesis, Hematochezia, Melena, Change in Bowel Habits,  Loss of Appetite  GU:  +Dysuria, +Urinary Retention, No Change in Color of Urine,  +Urgency, Urinary Frequency, No Flank pain.  Musculoskeletal: No Joint Pain or Swelling, No Decreased Range of Motion, No Back Pain.  Neurologic: No Syncope, No Seizures, Muscle Weakness, Paresthesia, Vision Disturbance or Loss, No Diplopia, No Vertigo, No Difficulty Walking,  Skin: No Rash or Lesions. Psych: No Change in Mood or Affect, No Depression or Anxiety, No Memory loss, No Confusion, or Hallucinations   Past Medical History  Diagnosis Date  . Hard of hearing      History reviewed. No pertinent past surgical history.    Prior to Admission medications   Medication Sig Start Date End Date Taking? Authorizing Provider  acetaminophen (TYLENOL) 500 MG tablet Take 500 mg by mouth every 6 (six) hours as needed for moderate pain.   Yes Historical Provider, MD  ciprofloxacin (CIPRO) 500 MG tablet Take 1 tablet (500 mg total) by mouth 2 (two) times daily. 10/15/14  Yes Jeffrey Hedges, PA-C  amoxicillin-clavulanate (AUGMENTIN) 875-125 MG per tablet Take 1 tablet by mouth 2 (two) times daily. One po bid x 7 days Patient not taking: Reported on 10/21/2014 10/19/13   Jarrett Soho Muthersbaugh, PA-C  HYDROcodone-acetaminophen (NORCO/VICODIN) 5-325 MG per tablet Take 1 tablet by mouth every 4 (four) hours as needed. Patient not taking: Reported on 10/21/2014 10/19/13   Jarrett Soho Muthersbaugh, PA-C  ondansetron (ZOFRAN ODT) 8 MG disintegrating tablet 8mg  ODT q4 hours prn nausea Patient not taking: Reported on 10/21/2014 10/19/13   Jarrett Soho Muthersbaugh, PA-C     Allergies  Allergen Reactions  . Phenobarbital Hives  . Fish Allergy Other (See Comments)  Tongue swelling, lethargy    Social History:  reports that he has never smoked. He does not have any smokeless tobacco history on file. He reports that he does not drink alcohol or use illicit drugs.    History reviewed. No pertinent family history.     Physical Exam:  GEN:  Pleasant Thin   39 y.o. Caucasian male examined and in no acute distress;  cooperative with exam Filed Vitals:   10/21/14 2100 10/21/14 2115 10/21/14 2130 10/21/14 2145  BP: 128/78 130/77 120/95 139/79  Pulse: 73 75 88 78  Temp:      TempSrc:      Resp:      SpO2: 100% 100% 100% 100%   Blood pressure 139/79, pulse 78, temperature 99 F (37.2 C), temperature source Oral, resp. rate 18, SpO2 100 %. PSYCH: He is alert and oriented x4; does not appear anxious does not appear depressed; affect is normal HEENT: Normocephalic and Atraumatic, Mucous membranes pink; PERRLA; EOM intact; Fundi:  Benign;  No scleral icterus, Nares: Patent, Oropharynx: Clear, Fair Dentition,    Neck:  FROM, No Cervical Lymphadenopathy nor Thyromegaly or Carotid Bruit; No JVD; Breasts:: Not examined CHEST WALL: No tenderness CHEST: Normal respiration, clear to auscultation bilaterally HEART: Regular rate and rhythm; no murmurs rubs or gallops BACK: No kyphosis or scoliosis; No CVA tenderness ABDOMEN: Positive Bowel Sounds, Scaphoid, Soft Non-Tender, No Rebound or Guarding; No Masses, No Organomegaly. Rectal Exam: Not done EXTREMITIES: No Cyanosis, Clubbing, or Edema; No Ulcerations. Genitalia: not examined PULSES: 2+ and symmetric SKIN: Normal hydration no rash or ulceration CNS:  Alert and Oriented x 4, No Focal Deficits Vascular: pulses palpable throughout    Labs on Admission:  Basic Metabolic Panel:  Recent Labs Lab 10/15/14 1217 10/21/14 1850  NA 134* 139  K 3.6 4.1  CL 96 102  CO2 27 26  GLUCOSE 151* 123*  BUN 27* 11  CREATININE 2.03* 1.20  CALCIUM 8.7 8.8   Liver Function Tests:  Recent Labs Lab 10/15/14 1217 10/21/14 1850  AST 34 26  ALT 26 26  ALKPHOS 54 63  BILITOT 0.6 0.7  PROT 7.2 6.7  ALBUMIN 3.4* 2.8*    Recent Labs Lab 10/21/14 1850  LIPASE 34   No results for input(s): AMMONIA in the last 168 hours. CBC:  Recent Labs Lab 10/15/14 1217 10/21/14 1850  WBC 10.1 9.9  NEUTROABS 8.4* 7.5  HGB 13.9 11.7*  HCT 41.1 36.6*  MCV 99.0 101.1*   PLT 69* 234   Cardiac Enzymes: No results for input(s): CKTOTAL, CKMB, CKMBINDEX, TROPONINI in the last 168 hours.  BNP (last 3 results) No results for input(s): BNP in the last 8760 hours.  ProBNP (last 3 results) No results for input(s): PROBNP in the last 8760 hours.  CBG: No results for input(s): GLUCAP in the last 168 hours.  Radiological Exams on Admission: Ct Abdomen Pelvis W Contrast  10/21/2014   CLINICAL DATA:  7 days of fever, fatigue, back pain. Was seen at [facility] last week for same symptoms, dx'd with UTI. Patient reports that he was seen in February for urinary retention and found to have an enlarged prostate. He was not treated for prostatitis at that time and was scheduled a follow-up appointment with urologist in April. Given doxazosin reports he has not been taking it. He reports frequent urinations, little production, suprapubic fullness, abdominal pain. He denies blood, discharge, painful urinations. He reports nausea, no vomiting. Mild frontal headache from time to time, no  associated neuro symptoms. Patient reports he's been treating his fever with Tylenol, MAXIMUM TEMPERATURE 105.1. States he must take Tylenol around the clock or his temperature will spike. Patient denies neck stiffness, chest pain, shortness of breath, vomiting, lower extremity swelling.  EXAM: CT ABDOMEN AND PELVIS WITH CONTRAST  TECHNIQUE: Multidetector CT imaging of the abdomen and pelvis was performed using the standard protocol following bolus administration of intravenous contrast.  CONTRAST:  42mL OMNIPAQUE IOHEXOL 300 MG/ML  SOLN  COMPARISON:  09/09/2009  FINDINGS: Minimal dependent atelectasis in the visualized lung bases. Unremarkable liver, nondilated gallbladder, spleen, adrenal glands, pancreas, abdominal aorta, portal vein.  New bilateral low attenuation renal lesions. Largest on the right in the lower pole measures approximately 9x28x 32 mm and is partially exophytic posteriorly, with  inflammatory/ edematous changes in the adjacent retroperitoneal fat. Largest on the left medially measures 15x24x40 mm and extends adjacent to the psoas musculature. There are multiple additional smaller foci bilaterally, more extensive on the right than left. There are wedge-shaped areas of decreased enhancement in both kidneys in the regions of the focal lesions. There is right hydronephrosis and ureterectasis down to the ureteral orifice. Urinary bladder is distended with mild diffuse wall thickening with a crenulated contour.  Stomach, small bowel, and colon are nondilated. No ascites. No free air. No adenopathy. Small Schmorl's nodes at all levels T8-L4. Sacral Tarlov cysts .  IMPRESSION: 1. Multiple bilateral renal abscesses. 2. Right hydronephrosis and ureterectasis without calculus or evident obstructing mass.   Electronically Signed   By: Lucrezia Europe M.D.   On: 10/21/2014 20:51       Assessment/Plan:   39 y.o. male with  Active Problems:   1.    Abscess, renal/perirenal   Blood and Urine cultures sent   IV Vancomycin , Rocephin and Metronidazole   Urology Consulted    2.   Hydronephrosis- seen on CT scan of ABD and Pelvis       3.   Urinary retention   On Doxazocin Rx   Foley Catheter placed in ED   Urology consulted    4.   Sepsis-   IV Abxs, and IVFs       5.   DVT Prophylaxis   Lovenox        Code Status:     FULL CODE       Family Communication:   Wife at Bedside  Disposition Plan:    Inpatient  Status        Time spent:  [facility] Pager [phone]   If [place] Please Contact the Day Rounding Team MD for [facility]  If 7PM-7AM, Please Contact Night-Floor Coverage  [email or web address] Password [facility] 10/21/2014, 10:43 PM     ADDENDUM:   Patient was seen and examined on 10/21/2014

## 2014-10-21 NOTE — ED Notes (Signed)
Pt here for abd pain, weird taste in mouth, cough and fever.

## 2014-10-21 NOTE — Progress Notes (Signed)
ANTIBIOTIC CONSULT NOTE - INITIAL  Pharmacy Consult for Vancomycin Indication: fevers, pyelonephritis, renal abscesses  Allergies  Allergen Reactions  . Phenobarbital Hives  . Fish Allergy Other (See Comments)    Tongue swelling, lethargy    Patient Measurements:   Ht: 75 in    Wt: 82 kg  Vital Signs: Temp: 99 F (37.2 C) (03/30 2050) Temp Source: Oral (03/30 2050) BP: 139/79 mmHg (03/30 2145) Pulse Rate: 78 (03/30 2145) Intake/Output from previous day:   Intake/Output from this shift: Total I/O In: -  Out: 700 [Urine:700]  Labs:  Recent Labs  10/21/14 1850  WBC 9.9  HGB 11.7*  PLT 234  CREATININE 1.20   Estimated Creatinine Clearance: 98.8 mL/min (by C-G formula based on Cr of 1.2). No results for input(s): VANCOTROUGH, VANCOPEAK, VANCORANDOM, GENTTROUGH, GENTPEAK, GENTRANDOM, TOBRATROUGH, TOBRAPEAK, TOBRARND, AMIKACINPEAK, AMIKACINTROU, AMIKACIN in the last 72 hours.   Microbiology: No results found for this or any previous visit (from the past 720 hour(s)).  Medical History: Past Medical History  Diagnosis Date  . Hard of hearing     Medications:  Prescriptions prior to admission  Medication Sig Dispense Refill Last Dose  . acetaminophen (TYLENOL) 500 MG tablet Take 500 mg by mouth every 6 (six) hours as needed for moderate pain.   10/21/2014 at 330 pm  . ciprofloxacin (CIPRO) 500 MG tablet Take 1 tablet (500 mg total) by mouth 2 (two) times daily. 28 tablet 0 10/21/2014 at Unknown time  . amoxicillin-clavulanate (AUGMENTIN) 875-125 MG per tablet Take 1 tablet by mouth 2 (two) times daily. One po bid x 7 days (Patient not taking: Reported on 10/21/2014) 14 tablet 0 Completed Course at Unknown time  . HYDROcodone-acetaminophen (NORCO/VICODIN) 5-325 MG per tablet Take 1 tablet by mouth every 4 (four) hours as needed. (Patient not taking: Reported on 10/21/2014) 10 tablet 0 Completed Course at Unknown time  . ondansetron (ZOFRAN ODT) 8 MG disintegrating tablet  8mg  ODT q4 hours prn nausea (Patient not taking: Reported on 10/21/2014) 4 tablet 0 Completed Course at Unknown time   Assessment: 39 y.o. male with UTI diagnosed ~1 wk ago and started Cipro course at home. No cx obtained. Pt now with persistent, recurrent fever to 105. CT shows multifocal small renal abscesses. Pt to start Rocephin and Vancomycin for fevers, renal abscesses, pyelonephritis. SCr 1.2, est CrCl 95 ml/min. WBC wnl. Cultures pending.  Goal of Therapy:  Vancomycin trough level 10-15 mcg/ml  Plan:  Vancomycin 1500mg  IV q12h F/u micro data, pt's clinical condition, renal function VT at [facility]  Sherlon Handing, PharmD, BCPS Clinical pharmacist, pager [phone] 10/21/2014,10:46 PM

## 2014-10-21 NOTE — Consult Note (Signed)
Reason for Consult: Renal Abscesses / Pyelonephritis, Urinary Retention, Mild Right Hydronephrosis  Referring Physician: Gasper Lloyd MD   Andrew Higgins is an 39 y.o. male.   HPI:   1- Renal Abscesses / Pyelonephritis - pt with "UTI" diagnosed about 1 week ago and started Cipro course by PCP, no CX's obtianed. F/u UCX at my office 3/29 negative for growth. Persistant / recurrent fever to 105 prompting repeat ER eval tonight and CT with multifocal small renal abscesses. New BCX, UCX obtined.   2 - Urinary Retention / Lower Urinary Tract Symptom - long h/o obstructive symptoms with urgency / freqeuncy and nocturnal incontinence x mos. PVR 64m noted in ER 09/2014. No h/o straddle injury / GU trauma / LE weakness / spine injury. CT w/o obvious cored compression / mass. 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 DRE 35gm smooth / non-tender. Cysto 09/2014 w/o stricutre / obstructing lesions and passed fill + pull 3/29, but in clinical retention again tonight with 700cc PVR, foley now placed again. He does admit to some new "unilateral weakness" and "favoring one side for few weeks", denies known neurologic disease other than sensirnural hearing loss.   3 - Mild Right Hydronephrosis - mild rt hydro to somewhat distended bladder by ER CT 09/2014, no stones, masses. Recent urinary retention as per above.   PMH sig for neural hearing loss, bilateral inguinal hernia repair. NO CV disease. No blood thinners.   Today " Andrew Higgins " is seen in consultation for above. He is referred Andrew Higgins. His PCP is Andrew Higgins with [facility]      Past Medical History  Diagnosis Date  . Hard of hearing     History reviewed. No pertinent past surgical history.  History reviewed. No pertinent family history.  Social History:  reports that he has never smoked. He does not have any smokeless tobacco history on file. He reports that he does not drink alcohol or use illicit drugs.  Allergies:   Allergies  Allergen Reactions  . Phenobarbital Hives  . Fish Allergy Other (See Comments)    Tongue swelling, lethargy    Medications: I have reviewed the patient's current medications.  Results for orders placed or performed during the hospital encounter of 10/21/14 (from the past 48 hour(s))  CBC with Differential     Status: Abnormal   Collection Time: 10/21/14  6:50 PM  Result Value Ref Range   WBC 9.9 4.0 - 10.5 K/uL   RBC 3.62 (L) 4.22 - 5.81 MIL/uL   Hemoglobin 11.7 (L) 13.0 - 17.0 g/dL   HCT 36.6 (L) 39.0 - 52.0 %   MCV 101.1 (H) 78.0 - 100.0 fL   MCH 32.3 26.0 - 34.0 pg   MCHC 32.0 30.0 - 36.0 g/dL   RDW 13.2 11.5 - 15.5 %   Platelets 234 150 - 400 K/uL   Neutrophils Relative % 76 43 - 77 %   Neutro Abs 7.5 1.7 - 7.7 K/uL   Lymphocytes Relative 13 12 - 46 %   Lymphs Abs 1.3 0.7 - 4.0 K/uL   Monocytes Relative 9 3 - 12 %   Monocytes Absolute 0.9 0.1 - 1.0 K/uL   Eosinophils Relative 2 0 - 5 %   Eosinophils Absolute 0.2 0.0 - 0.7 K/uL   Basophils Relative 0 0 - 1 %   Basophils Absolute 0.0 0.0 - 0.1 K/uL  Comprehensive metabolic panel     Status: Abnormal   Collection Time: 10/21/14  6:50 PM  Result Value Ref Range   Sodium 139 135 - 145 mmol/L   Potassium 4.1 3.5 - 5.1 mmol/L   Chloride 102 96 - 112 mmol/L   CO2 26 19 - 32 mmol/L   Glucose, Bld 123 (H) 70 - 99 mg/dL   BUN 11 6 - 23 mg/dL   Creatinine, Ser 1.20 0.50 - 1.35 mg/dL   Calcium 8.8 8.4 - 10.5 mg/dL   Total Protein 6.7 6.0 - 8.3 g/dL   Albumin 2.8 (L) 3.5 - 5.2 g/dL   AST 26 0 - 37 U/L   ALT 26 0 - 53 U/L   Alkaline Phosphatase 63 39 - 117 U/L   Total Bilirubin 0.7 0.3 - 1.2 mg/dL   GFR calc non Af Amer 76 (L) >90 mL/min   GFR calc Af Amer 89 (L) >90 mL/min    Comment: (NOTE) The eGFR has been calculated using the CKD EPI equation. This calculation has not been validated in all clinical situations. eGFR's persistently <90 mL/min signify possible Chronic Kidney Disease.    Anion gap 11 5 - 15   Lipase, blood     Status: None   Collection Time: 10/21/14  6:50 PM  Result Value Ref Range   Lipase 34 11 - 59 U/L  Urinalysis, Routine w reflex microscopic     Status: Abnormal   Collection Time: 10/21/14  7:41 PM  Result Value Ref Range   Color, Urine YELLOW YELLOW   APPearance CLEAR CLEAR   Specific Gravity, Urine 1.010 1.005 - 1.030   pH 6.5 5.0 - 8.0   Glucose, UA NEGATIVE NEGATIVE mg/dL   Hgb urine dipstick MODERATE (A) NEGATIVE   Bilirubin Urine NEGATIVE NEGATIVE   Ketones, ur NEGATIVE NEGATIVE mg/dL   Protein, ur NEGATIVE NEGATIVE mg/dL   Urobilinogen, UA 1.0 0.0 - 1.0 mg/dL   Nitrite NEGATIVE NEGATIVE   Leukocytes, UA MODERATE (A) NEGATIVE  Urine microscopic-add on     Status: None   Collection Time: 10/21/14  7:41 PM  Result Value Ref Range   WBC, UA 21-50 <3 WBC/hpf   RBC / HPF 11-20 <3 RBC/hpf   Bacteria, UA RARE RARE    Ct Abdomen Pelvis W Contrast  10/21/2014   CLINICAL DATA:  7 days of fever, fatigue, back pain. Was seen at [facility] last week for same symptoms, dx'd with UTI. Patient reports that he was seen in February for urinary retention and found to have an enlarged prostate. He was not treated for prostatitis at that time and was scheduled a follow-up appointment with urologist in April. Given doxazosin reports he has not been taking it. He reports frequent urinations, little production, suprapubic fullness, abdominal pain. He denies blood, discharge, painful urinations. He reports nausea, no vomiting. Mild frontal headache from time to time, no associated neuro symptoms. Patient reports he's been treating his fever with Tylenol, MAXIMUM TEMPERATURE 105.1. States he must take Tylenol around the clock or his temperature will spike. Patient denies neck stiffness, chest pain, shortness of breath, vomiting, lower extremity swelling.  EXAM: CT ABDOMEN AND PELVIS WITH CONTRAST  TECHNIQUE: Multidetector CT imaging of the abdomen and pelvis was performed using the standard  protocol following bolus administration of intravenous contrast.  CONTRAST:  75m OMNIPAQUE IOHEXOL 300 MG/ML  SOLN  COMPARISON:  09/09/2009  FINDINGS: Minimal dependent atelectasis in the visualized lung bases. Unremarkable liver, nondilated gallbladder, spleen, adrenal glands, pancreas, abdominal aorta, portal vein.  New bilateral low attenuation renal lesions. Largest on the  right in the lower pole measures approximately 9x28x 32 mm and is partially exophytic posteriorly, with inflammatory/ edematous changes in the adjacent retroperitoneal fat. Largest on the left medially measures 15x24x40 mm and extends adjacent to the psoas musculature. There are multiple additional smaller foci bilaterally, more extensive on the right than left. There are wedge-shaped areas of decreased enhancement in both kidneys in the regions of the focal lesions. There is right hydronephrosis and ureterectasis down to the ureteral orifice. Urinary bladder is distended with mild diffuse wall thickening with a crenulated contour.  Stomach, small bowel, and colon are nondilated. No ascites. No free air. No adenopathy. Small Schmorl's nodes at all levels T8-L4. Sacral Tarlov cysts .  IMPRESSION: 1. Multiple bilateral renal abscesses. 2. Right hydronephrosis and ureterectasis without calculus or evident obstructing mass.   Electronically Signed   By: Lucrezia Europe M.D.   On: 10/21/2014 20:51    Review of Systems  Constitutional: Positive for fever, chills, weight loss and malaise/fatigue.  HENT: Negative.   Eyes: Negative.   Respiratory: Negative.   Cardiovascular: Negative.   Gastrointestinal: Negative.   Genitourinary: Positive for flank pain. Negative for hematuria.  Musculoskeletal: Negative.   Skin: Negative.   Neurological: Negative.   Endo/Heme/Allergies: Negative.   Psychiatric/Behavioral: Negative.    Blood pressure 134/73, pulse 75, temperature 99 F (37.2 C), temperature source Oral, resp. rate 18, SpO2 100  %. Physical Exam  Constitutional: He appears well-developed.  HENT:  Head: Normocephalic.  Eyes: Pupils are equal, round, and reactive to light.  Neck: Normal range of motion.  Cardiovascular:  Regular tachycardia  Respiratory: Effort normal.  GI: Soft.  Genitourinary: Penis normal.  Foley c/d/i with 700cc urine after void  Musculoskeletal: Normal range of motion.  Neurological: He is alert.  Skin: Skin is warm.  Psychiatric: He has a normal mood and affect. Judgment and thought content normal.    Assessment/Plan:  1- Renal Abscesses / Pyelonephritis -impressive infection. Agree with admission with empiric broad spectrum IV ABX, will likely need several weeks of IV therapy. Challenge may be obtaining acurate CX data. May be good to try to contact [facility] to see if they have any recent positive culture data as CX from my office negative (after starting Cipro).  Agree with Lenna Gilford today [facility] often more sensitive in this setting, possibly even dominant abcess aspiration to obtain CX data). I do not feel any sort of permant renal drains / stents warranted as these abscesses are quite small and multifocal. Consider ID consultation for duration of ABX.   2 - Urinary Retention / Lower Urinary Tract Symptom -  No lower tract stricture / obsruction by cysto. I am concerned there may be neurologic origin to this (MS?, other). I have reviewed the patient's CT with radiologist and no overt signs of cord compression noted, most sensitive study to rule out that or MS would be MRI brain / total spine with contrast which I feel is warranted at some point this admission.    3 - Mild Right Hydronephrosis - Favor reflux from recent urinary retention given no stone / mass on imaging.   I am leaving town in [place] but will have one of my colleagues continue to follow the pt in house, call with questions anytime.   Nyeisha Goodall 10/21/2014, 9:39 PM

## 2014-10-21 NOTE — ED Provider Notes (Signed)
CSN: [id number]     Arrival date & time 10/21/14  1819 History   First MD Initiated Contact with Patient 10/21/14 1911     Chief Complaint  Patient presents with  . Abdominal Pain  . Cough     (Consider location/radiation/quality/duration/timing/severity/associated sxs/prior Treatment) HPI Andrew Higgins is a 39 y.o. male with no medical problems, presents to ED with complaint of fever, urinary symptoms, flank pain, and dry cough. Patient states his urinary symptoms started a little over a week ago. States he is having frequent urinations, slow stream, burning with urination. States having right flank pain. Fevers up to 105 at home for the last 5 days. Patient states he is having suprapubic pain as well. Also complaining of dry nonproductive cough. States when he takes a deep breath with his abdomen he is having some discomfort. He was seen in emergency department one week ago, diagnosed with urinary retention and UTI. He was discharged home with Cipro and Foley catheter. Patient followed up with urologist yesterday. States had a procedure in the office done where physician looked with a camera in his urethra, examined his prostate, stated that everything looked normal. He scheduled him for CT scan outpatient. Patient however continued to have high fever and pain and decided to come to emergency department for further evaluation. She has been taking Tylenol for the pain and fever.   Past Medical History  Diagnosis Date  . Hard of hearing    History reviewed. No pertinent past surgical history. History reviewed. No pertinent family history. History  Substance Use Topics  . Smoking status: Never Smoker   . Smokeless tobacco: Not on file  . Alcohol Use: No    Review of Systems  Constitutional: Positive for fever and chills.  Respiratory: Positive for cough. Negative for chest tightness and shortness of breath.   Cardiovascular: Negative for chest pain, palpitations and leg swelling.   Gastrointestinal: Positive for abdominal pain. Negative for nausea, vomiting, diarrhea and abdominal distention.  Genitourinary: Positive for dysuria, frequency, flank pain and difficulty urinating. Negative for urgency, hematuria, discharge, penile swelling, scrotal swelling, penile pain and testicular pain.  Musculoskeletal: Negative for myalgias, arthralgias, neck pain and neck stiffness.  Skin: Negative for rash.  Allergic/Immunologic: Negative for immunocompromised state.  Neurological: Negative for dizziness, weakness, light-headedness, numbness and headaches.  All other systems reviewed and are negative.     Allergies  Phenobarbital and Fish allergy  Home Medications   Prior to Admission medications   Medication Sig Start Date End Date Taking? Authorizing Provider  acetaminophen (TYLENOL) 500 MG tablet Take 500 mg by mouth every 6 (six) hours as needed for moderate pain.   Yes Historical Provider, MD  ciprofloxacin (CIPRO) 500 MG tablet Take 1 tablet (500 mg total) by mouth 2 (two) times daily. 10/15/14  Yes Jeffrey Hedges, PA-C  amoxicillin-clavulanate (AUGMENTIN) 875-125 MG per tablet Take 1 tablet by mouth 2 (two) times daily. One po bid x 7 days Patient not taking: Reported on 10/21/2014 10/19/13   Jarrett Soho Muthersbaugh, PA-C  HYDROcodone-acetaminophen (NORCO/VICODIN) 5-325 MG per tablet Take 1 tablet by mouth every 4 (four) hours as needed. Patient not taking: Reported on 10/21/2014 10/19/13   Jarrett Soho Muthersbaugh, PA-C  ondansetron (ZOFRAN ODT) 8 MG disintegrating tablet 8mg  ODT q4 hours prn nausea Patient not taking: Reported on 10/21/2014 10/19/13   Jarrett Soho Muthersbaugh, PA-C   BP 134/73 mmHg  Pulse 75  Temp(Src) 99 F (37.2 C) (Oral)  Resp 18  SpO2 100% Physical Exam  Constitutional:  He is oriented to person, place, and time. He appears well-developed and well-nourished. No distress.  HENT:  Head: Normocephalic and atraumatic.  Eyes: Conjunctivae are normal.  Neck: Neck  supple.  Cardiovascular: Normal rate, regular rhythm and normal heart sounds.   Pulmonary/Chest: Effort normal. No respiratory distress. He has no wheezes. He has no rales.  Abdominal: Soft. Bowel sounds are normal. He exhibits no distension. There is tenderness. There is no rebound.  Suprapubic tenderness. Right CVA tenderness  Musculoskeletal: He exhibits no edema.  Neurological: He is alert and oriented to person, place, and time.  Skin: Skin is warm and dry.  Nursing note and vitals reviewed.   ED Course  Procedures (including critical care time) Labs Review Labs Reviewed  CBC WITH DIFFERENTIAL/PLATELET - Abnormal; Notable for the following:    RBC 3.62 (*)    Hemoglobin 11.7 (*)    HCT 36.6 (*)    MCV 101.1 (*)    All other components within normal limits  COMPREHENSIVE METABOLIC PANEL - Abnormal; Notable for the following:    Glucose, Bld 123 (*)    Albumin 2.8 (*)    GFR calc non Af Amer 76 (*)    GFR calc Af Amer 89 (*)    All other components within normal limits  URINALYSIS, ROUTINE W REFLEX MICROSCOPIC - Abnormal; Notable for the following:    Hgb urine dipstick MODERATE (*)    Leukocytes, UA MODERATE (*)    All other components within normal limits  URINE CULTURE  CULTURE, BLOOD (ROUTINE X 2)  CULTURE, BLOOD (ROUTINE X 2)  LIPASE, BLOOD  URINE MICROSCOPIC-ADD ON  HIV ANTIBODY (ROUTINE TESTING)  GC/CHLAMYDIA PROBE AMP ([place])    Imaging Review Ct Abdomen Pelvis W Contrast  10/21/2014   CLINICAL DATA:  7 days of fever, fatigue, back pain. Was seen at [facility] last week for same symptoms, dx'd with UTI. Patient reports that he was seen in February for urinary retention and found to have an enlarged prostate. He was not treated for prostatitis at that time and was scheduled a follow-up appointment with urologist in April. Given doxazosin reports he has not been taking it. He reports frequent urinations, little production, suprapubic fullness, abdominal pain. He  denies blood, discharge, painful urinations. He reports nausea, no vomiting. Mild frontal headache from time to time, no associated neuro symptoms. Patient reports he's been treating his fever with Tylenol, MAXIMUM TEMPERATURE 105.1. States he must take Tylenol around the clock or his temperature will spike. Patient denies neck stiffness, chest pain, shortness of breath, vomiting, lower extremity swelling.  EXAM: CT ABDOMEN AND PELVIS WITH CONTRAST  TECHNIQUE: Multidetector CT imaging of the abdomen and pelvis was performed using the standard protocol following bolus administration of intravenous contrast.  CONTRAST:  26mL OMNIPAQUE IOHEXOL 300 MG/ML  SOLN  COMPARISON:  09/09/2009  FINDINGS: Minimal dependent atelectasis in the visualized lung bases. Unremarkable liver, nondilated gallbladder, spleen, adrenal glands, pancreas, abdominal aorta, portal vein.  New bilateral low attenuation renal lesions. Largest on the right in the lower pole measures approximately 9x28x 32 mm and is partially exophytic posteriorly, with inflammatory/ edematous changes in the adjacent retroperitoneal fat. Largest on the left medially measures 15x24x40 mm and extends adjacent to the psoas musculature. There are multiple additional smaller foci bilaterally, more extensive on the right than left. There are wedge-shaped areas of decreased enhancement in both kidneys in the regions of the focal lesions. There is right hydronephrosis and ureterectasis down to the ureteral orifice.  Urinary bladder is distended with mild diffuse wall thickening with a crenulated contour.  Stomach, small bowel, and colon are nondilated. No ascites. No free air. No adenopathy. Small Schmorl's nodes at all levels T8-L4. Sacral Tarlov cysts .  IMPRESSION: 1. Multiple bilateral renal abscesses. 2. Right hydronephrosis and ureterectasis without calculus or evident obstructing mass.   Electronically Signed   By: Lucrezia Europe M.D.   On: 10/21/2014 20:51     EKG  Interpretation None      MDM   Final diagnoses:  Renal abscess   patient with fever up to 105 at home, dysuria, urinary frequency and urgency, right CVA tenderness. Was evaluated 6 days ago to emergency department, urine infected, started on Cipro, was found to have urinary retention and was discharged with a Foley. No cultures obtained at that time. Patient was seen by urologist yesterday, Foley removed yesterday, states he is urinating a little better but still having some hesitancy and difficulty with urination. Continues to have high fevers at home. Continues to have increasing pain. Will get labs, urinalysis, will get CT abdomen and pelvis.   9:59 PM Patient CT showing multiple bilateral renal abscesses. I discussed this with Dr. Tresa Moore urology who has seen patient yesterday. Advised antibiotics, admission to the hospital, replace Foley if retaining urine, blood cultures. His postvoid residual showed 600 mL of urine in his bladder. Foley was replaced. Spoke with [facility] hospitalist, will admit patient. Initially started him on Rocephin, Ativan vancomycin and Flagyl per their request. Patient is hemodynamically stable. No evidence of sepsis at this time. He is afebrile emergency department. He appears to be comfortable. He'll be admitted for further treatment.  Filed Vitals:   10/21/14 2100 10/21/14 2115 10/21/14 2130 10/21/14 2145  BP: 128/78 130/77 120/95 139/79  Pulse: 73 75 88 78  Temp:      TempSrc:      Resp:      SpO2: 100% 100% 100% 100%     Jeannett Senior, PA-C 10/23/14 0025  Dorie Rank, MD 10/24/14 1517

## 2014-10-21 NOTE — Progress Notes (Signed)
Called ER RN for report. Roomready for admit. 

## 2014-10-21 NOTE — ED Notes (Signed)
phlebotoy at bedside.

## 2014-10-21 NOTE — ED Notes (Signed)
Bladder scan showed >570, will insert foley catheter.

## 2014-10-22 LAB — BASIC METABOLIC PANEL
ANION GAP: 7 (ref 5–15)
BUN: 9 mg/dL (ref 6–23)
CHLORIDE: 106 mmol/L (ref 96–112)
CO2: 26 mmol/L (ref 19–32)
Calcium: 8.3 mg/dL — ABNORMAL LOW (ref 8.4–10.5)
Creatinine, Ser: 1.16 mg/dL (ref 0.50–1.35)
GFR, EST NON AFRICAN AMERICAN: 80 mL/min — AB (ref >90)
Glucose, Bld: 105 mg/dL — ABNORMAL HIGH (ref 70–99)
Potassium: 4.1 mmol/L (ref 3.5–5.1)
Sodium: 139 mmol/L (ref 135–145)

## 2014-10-22 LAB — HIV ANTIBODY (ROUTINE TESTING W REFLEX): HIV Screen 4th Generation wRfx: NONREACTIVE

## 2014-10-22 LAB — GC/CHLAMYDIA PROBE AMP (~~LOC~~) NOT AT ARMC
CHLAMYDIA, DNA PROBE: NEGATIVE
Neisseria Gonorrhea: NEGATIVE

## 2014-10-22 LAB — CBC
HCT: 35.5 % — ABNORMAL LOW (ref 39.0–52.0)
Hemoglobin: 11.7 g/dL — ABNORMAL LOW (ref 13.0–17.0)
MCH: 32.9 pg (ref 26.0–34.0)
MCHC: 33 g/dL (ref 30.0–36.0)
MCV: 99.7 fL (ref 78.0–100.0)
PLATELETS: 249 10*3/uL (ref 150–400)
RBC: 3.56 MIL/uL — ABNORMAL LOW (ref 4.22–5.81)
RDW: 13.1 % (ref 11.5–15.5)
WBC: 12.1 10*3/uL — AB (ref 4.0–10.5)

## 2014-10-22 NOTE — Progress Notes (Signed)
Utilization review Completed Amari Zagal RN BSN   

## 2014-10-22 NOTE — Progress Notes (Signed)
PROGRESS NOTE  Andrew Higgins FFM:[id number] DOB: 1975-08-06 DOA: 10/21/2014 PCP: [name]  Assessment/Plan: Abscess, renal/perirenal Blood and Urine cultures sent IV Vancomycin , Rocephin and Metronidazole Urology Consulted   -PCP faxing over urine culture results   Hydronephrosis- seen on CT scan of ABD and Pelvis   Urinary retention with AKI On Doxazocin Rx Foley Catheter placed in ED Urology consulted  Sepsis- IV Abxs, and IVFs   Code Status: full Family Communication: mother at bedside Disposition Plan:    Consultants:  urology  Procedures:      HPI/Subjective: No SOB, no CP +weight loss  Objective: Filed Vitals:   10/22/14 0500  BP: 114/59  Pulse: 79  Temp: 99.5 F (37.5 C)  Resp: 14    Intake/Output Summary (Last 24 hours) at 10/22/14 0956 Last data filed at 10/21/14 2212  Gross per 24 hour  Intake      0 ml  Output    700 ml  Net   -700 ml   Filed Weights   10/21/14 2258  Weight: 79.9 kg (176 lb 2.4 oz)    Exam:   General:  A+Ox3, NAD- hard of hearing  Cardiovascular: rrr  Respiratory: clear  Abdomen: +BS  Musculoskeletal: no edema  Data Reviewed: Basic Metabolic Panel:  Recent Labs Lab 10/15/14 1217 10/21/14 1850 10/22/14 0704  NA 134* 139 139  K 3.6 4.1 4.1  CL 96 102 106  CO2 27 26 26   GLUCOSE 151* 123* 105*  BUN 27* 11 9  CREATININE 2.03* 1.20 1.16  CALCIUM 8.7 8.8 8.3*   Liver Function Tests:  Recent Labs Lab 10/15/14 1217 10/21/14 1850  AST 34 26  ALT 26 26  ALKPHOS 54 63  BILITOT 0.6 0.7  PROT 7.2 6.7  ALBUMIN 3.4* 2.8*    Recent Labs Lab 10/21/14 1850  LIPASE 34   No results for input(s): AMMONIA in the last 168 hours. CBC:  Recent Labs Lab 10/15/14 1217 10/21/14 1850 10/22/14 0704   WBC 10.1 9.9 12.1*  NEUTROABS 8.4* 7.5  --   HGB 13.9 11.7* 11.7*  HCT 41.1 36.6* 35.5*  MCV 99.0 101.1* 99.7  PLT 69* 234 249   Cardiac Enzymes: No results for input(s): CKTOTAL, CKMB, CKMBINDEX, TROPONINI in the last 168 hours. BNP (last 3 results) No results for input(s): BNP in the last 8760 hours.  ProBNP (last 3 results) No results for input(s): PROBNP in the last 8760 hours.  CBG: No results for input(s): GLUCAP in the last 168 hours.  No results found for this or any previous visit (from the past 240 hour(s)).   Studies: Ct Abdomen Pelvis W Contrast  10/21/2014   CLINICAL DATA:  7 days of fever, fatigue, back pain. Was seen at [facility] last week for same symptoms, dx'd with UTI. Patient reports that he was seen in February for urinary retention and found to have an enlarged prostate. He was not treated for prostatitis at that time and was scheduled a follow-up appointment with urologist in April. Given doxazosin reports he has not been taking it. He reports frequent urinations, little production, suprapubic fullness, abdominal pain. He denies blood, discharge, painful urinations. He reports nausea, no vomiting. Mild frontal headache from time to time, no associated neuro symptoms. Patient reports he's been treating his fever with Tylenol, MAXIMUM TEMPERATURE 105.1. States he must take Tylenol around the clock or his temperature will spike. Patient denies neck stiffness, chest pain, shortness of breath, vomiting, lower extremity swelling.  EXAM: CT ABDOMEN AND  PELVIS WITH CONTRAST  TECHNIQUE: Multidetector CT imaging of the abdomen and pelvis was performed using the standard protocol following bolus administration of intravenous contrast.  CONTRAST:  41mL OMNIPAQUE IOHEXOL 300 MG/ML  SOLN  COMPARISON:  09/09/2009  FINDINGS: Minimal dependent atelectasis in the visualized lung bases. Unremarkable liver, nondilated gallbladder, spleen, adrenal glands, pancreas, abdominal aorta, portal vein.   New bilateral low attenuation renal lesions. Largest on the right in the lower pole measures approximately 9x28x 32 mm and is partially exophytic posteriorly, with inflammatory/ edematous changes in the adjacent retroperitoneal fat. Largest on the left medially measures 15x24x40 mm and extends adjacent to the psoas musculature. There are multiple additional smaller foci bilaterally, more extensive on the right than left. There are wedge-shaped areas of decreased enhancement in both kidneys in the regions of the focal lesions. There is right hydronephrosis and ureterectasis down to the ureteral orifice. Urinary bladder is distended with mild diffuse wall thickening with a crenulated contour.  Stomach, small bowel, and colon are nondilated. No ascites. No free air. No adenopathy. Small Schmorl's nodes at all levels T8-L4. Sacral Tarlov cysts .  IMPRESSION: 1. Multiple bilateral renal abscesses. 2. Right hydronephrosis and ureterectasis without calculus or evident obstructing mass.   Electronically Signed   By: Lucrezia Europe M.D.   On: 10/21/2014 20:51    Scheduled Meds: . cefTRIAXone (ROCEPHIN)  IV  1 g Intravenous Q24H  . enoxaparin (LOVENOX) injection  40 mg Subcutaneous Q24H  . metronidazole  500 mg Intravenous Q8H  . vancomycin  1,500 mg Intravenous Q12H   Continuous Infusions: . sodium chloride     Antibiotics Given (last 72 hours)    Date/Time Action Medication Dose Rate   10/22/14 0112 Given   vancomycin (VANCOCIN) 1,500 mg in sodium chloride 0.9 % 500 mL IVPB 1,500 mg 250 mL/hr   10/22/14 0445 Given   metroNIDAZOLE (FLAGYL) IVPB 500 mg 500 mg 100 mL/hr      Active Problems:   Abscess, renal/perirenal   Hydronephrosis   Urinary retention   Sepsis   Renal abscess    Time spent: 25 min    Anayla Giannetti, [facility] Pager [phone]. If 7PM-7AM, please contact night-coverage at [email or web address], password [facility] 10/22/2014, 9:56 AM  LOS: 1 day

## 2014-10-23 ENCOUNTER — Inpatient Hospital Stay (HOSPITAL_COMMUNITY): Payer: 59

## 2014-10-23 LAB — BASIC METABOLIC PANEL
ANION GAP: 5 (ref 5–15)
BUN: 9 mg/dL (ref 6–23)
CALCIUM: 8.4 mg/dL (ref 8.4–10.5)
CO2: 29 mmol/L (ref 19–32)
CREATININE: 1.18 mg/dL (ref 0.50–1.35)
Chloride: 105 mmol/L (ref 96–112)
GFR, EST NON AFRICAN AMERICAN: 78 mL/min — AB (ref >90)
Glucose, Bld: 107 mg/dL — ABNORMAL HIGH (ref 70–99)
Potassium: 4.1 mmol/L (ref 3.5–5.1)
Sodium: 139 mmol/L (ref 135–145)

## 2014-10-23 LAB — URINE CULTURE
Colony Count: NO GROWTH
Culture: NO GROWTH

## 2014-10-23 LAB — CBC
HEMATOCRIT: 35.7 % — AB (ref 39.0–52.0)
Hemoglobin: 11.5 g/dL — ABNORMAL LOW (ref 13.0–17.0)
MCH: 32.2 pg (ref 26.0–34.0)
MCHC: 32.2 g/dL (ref 30.0–36.0)
MCV: 100 fL (ref 78.0–100.0)
PLATELETS: 267 10*3/uL (ref 150–400)
RBC: 3.57 MIL/uL — AB (ref 4.22–5.81)
RDW: 13.2 % (ref 11.5–15.5)
WBC: 10.7 10*3/uL — ABNORMAL HIGH (ref 4.0–10.5)

## 2014-10-23 MED ORDER — SODIUM CHLORIDE 0.9 % IV SOLN
INTRAVENOUS | Status: AC
  Administered 2014-10-23: 10:00:00 via INTRAVENOUS

## 2014-10-23 MED ORDER — POLYETHYLENE GLYCOL 3350 17 G PO PACK
17.0000 g | PACK | Freq: Every day | ORAL | Status: DC
  Filled 2014-10-23 (×2): qty 1

## 2014-10-23 MED ORDER — DEXTROSE 5 % IV SOLN: 1.0000 g | INTRAVENOUS | Status: DC

## 2014-10-23 MED ORDER — GADOBENATE DIMEGLUMINE 529 MG/ML IV SOLN
17.0000 mL | Freq: Once | INTRAVENOUS | Status: AC | PRN
  Administered 2014-10-23: 17 mL via INTRAVENOUS

## 2014-10-23 NOTE — Care Management Note (Signed)
CARE MANAGEMENT NOTE 10/23/2014  Patient:  HARTFORD, MAULDEN   Account Number:  [id number]  Date Initiated:  10/23/2014  Documentation initiated by:  Lyndon Chapel  Subjective/Objective Assessment:   CM following for progression and d/c planning.     Action/Plan:   Met with pt and wife, re [facility] services for IV antibiotics. Pt wife selected [facility] for these services, [place] notified, await prescription.   Anticipated DC Date:  10/25/2014   Anticipated DC Plan:  [place]         [facility] Choice  HOME HEALTH   Choice offered to / List presented to:  C-1 Patient   DME arranged  IV PUMP/EQUIPMENT      DME agency  [place] arranged  HH-1 RN      [place].   Status of service:  In process, will continue to follow Medicare Important Message given?  NO (If response is "NO", the following Medicare IM given date fields will be blank) Date Medicare IM given:   Medicare IM given by:   Date Additional Medicare IM given:   Additional Medicare IM given by:    Discharge Disposition:  [place]  Per UR Regulation:    If discussed at Long Length of Stay Meetings, dates discussed:    Comments:

## 2014-10-23 NOTE — Progress Notes (Signed)
PROGRESS NOTE  Andrew Higgins VXB:[id number] DOB: [birth year] DOA: 10/21/2014 PCP: [name]  Assessment/Plan: Abscess, renal/perirenal Blood and Urine cultures sent-  NGTD IV Vancomycin , Rocephin and Metronidazole- spoke with ID- deescalate to rocephin and could go with 2 weeks   of treatment and follow up CT Scan for resolution of abscesses Urology Consulted- will see today   -PCP faxing over urine culture results- no culture done, just u/a   -PICC line placement   Hydronephrosis- seen on CT scan of ABD and Pelvis   Urinary retention with AKI On Doxazocin Rx Foley Catheter placed in ED- will go home with foley Urology consulted  Sepsis- resolved   Code Status: full Family Communication: mother/wife at bedside Disposition Plan:    Consultants:  urology  Procedures:      HPI/Subjective: Feeling well Minimal abd pain on left side  Objective: Filed Vitals:   10/23/14 0603  BP: 113/64  Pulse: 76  Temp: 98.1 F (36.7 C)  Resp: 16    Intake/Output Summary (Last 24 hours) at 10/23/14 0857 Last data filed at 10/23/14 0604  Gross per 24 hour  Intake    960 ml  Output   4651 ml  Net  -3691 ml   Filed Weights   10/21/14 2258 10/23/14 0603  Weight: 79.9 kg (176 lb 2.4 oz) 79.243 kg (174 lb 11.2 oz)    Exam:   General:  A+Ox3, NAD- hard of hearing  Cardiovascular: rrr  Respiratory: clear  Abdomen: +BS  Musculoskeletal: no edema  Data Reviewed: Basic Metabolic Panel:  Recent Labs Lab 10/21/14 1850 10/22/14 0704 10/23/14 0542  NA 139 139 139  K 4.1 4.1 4.1  CL 102 106 105  CO2 26 26 29   GLUCOSE 123* 105* 107*  BUN 11 9 9   CREATININE 1.20 1.16 1.18  CALCIUM 8.8 8.3* 8.4   Liver Function Tests:  Recent Labs Lab 10/21/14 1850  AST 26   ALT 26  ALKPHOS 63  BILITOT 0.7  PROT 6.7  ALBUMIN 2.8*    Recent Labs Lab 10/21/14 1850  LIPASE 34   No results for input(s): AMMONIA in the last 168 hours. CBC:  Recent Labs Lab 10/21/14 1850 10/22/14 0704 10/23/14 0542  WBC 9.9 12.1* 10.7*  NEUTROABS 7.5  --   --   HGB 11.7* 11.7* 11.5*  HCT 36.6* 35.5* 35.7*  MCV 101.1* 99.7 100.0  PLT 234 249 267   Cardiac Enzymes: No results for input(s): CKTOTAL, CKMB, CKMBINDEX, TROPONINI in the last 168 hours. BNP (last 3 results) No results for input(s): BNP in the last 8760 hours.  ProBNP (last 3 results) No results for input(s): PROBNP in the last 8760 hours.  CBG: No results for input(s): GLUCAP in the last 168 hours.  Recent Results (from the past 240 hour(s))  Urine culture     Status: None   Collection Time: 10/21/14  7:41 PM  Result Value Ref Range Status   Specimen Description URINE, CLEAN CATCH  Final   Special Requests NONE  Final   [place]ony Count NO GROWTH Performed at [facility]   Final   Culture NO GROWTH Performed at [facility]   Final   Report Status 10/23/2014 FINAL  Final  Blood culture (routine x 2)     Status: None (Preliminary result)   Collection Time: 10/21/14 10:15 PM  Result Value Ref Range Status   Specimen Description BLOOD LEFT ANTECUBITAL  Final   Special Requests BOTTLES DRAWN AEROBIC AND ANAEROBIC 5CC  Final   Culture   Final           BLOOD CULTURE RECEIVED NO GROWTH TO DATE CULTURE WILL BE HELD FOR 5 DAYS BEFORE ISSUING A FINAL NEGATIVE REPORT Performed at [facility]    Report Status PENDING  Incomplete  Blood culture (routine x 2)     Status: None (Preliminary result)   Collection Time: 10/21/14 10:22 PM  Result Value Ref Range Status   Specimen Description BLOOD LEFT WRIST  Final   Special Requests BOTTLES DRAWN AEROBIC AND ANAEROBIC 3CC  Final   Culture   Final           BLOOD CULTURE RECEIVED NO GROWTH TO DATE CULTURE WILL BE HELD FOR 5 DAYS  BEFORE ISSUING A FINAL NEGATIVE REPORT Performed at [facility]    Report Status PENDING  Incomplete     Studies: Ct Abdomen Pelvis W Contrast  10/21/2014   CLINICAL DATA:  7 days of fever, fatigue, back pain. Was seen at [facility] last week for same symptoms, dx'd with UTI. Patient reports that he was seen in February for urinary retention and found to have an enlarged prostate. He was not treated for prostatitis at that time and was scheduled a follow-up appointment with urologist in April. Given doxazosin reports he has not been taking it. He reports frequent urinations, little production, suprapubic fullness, abdominal pain. He denies blood, discharge, painful urinations. He reports nausea, no vomiting. Mild frontal headache from time to time, no associated neuro symptoms. Patient reports he's been treating his fever with Tylenol, MAXIMUM TEMPERATURE 105.1. States he must take Tylenol around the clock or his temperature will spike. Patient denies neck stiffness, chest pain, shortness of breath, vomiting, lower extremity swelling.  EXAM: CT ABDOMEN AND PELVIS WITH CONTRAST  TECHNIQUE: Multidetector CT imaging of the abdomen and pelvis was performed using the standard protocol following bolus administration of intravenous contrast.  CONTRAST:  75mL OMNIPAQUE IOHEXOL 300 MG/ML  SOLN  COMPARISON:  09/09/2009  FINDINGS: Minimal dependent atelectasis in the visualized lung bases. Unremarkable liver, nondilated gallbladder, spleen, adrenal glands, pancreas, abdominal aorta, portal vein.  New bilateral low attenuation renal lesions. Largest on the right in the lower pole measures approximately 9x28x 32 mm and is partially exophytic posteriorly, with inflammatory/ edematous changes in the adjacent retroperitoneal fat. Largest on the left medially measures 15x24x40 mm and extends adjacent to the psoas musculature. There are multiple additional smaller foci bilaterally, more extensive on the right than left.  There are wedge-shaped areas of decreased enhancement in both kidneys in the regions of the focal lesions. There is right hydronephrosis and ureterectasis down to the ureteral orifice. Urinary bladder is distended with mild diffuse wall thickening with a crenulated contour.  Stomach, small bowel, and colon are nondilated. No ascites. No free air. No adenopathy. Small Schmorl's nodes at all levels T8-L4. Sacral Tarlov cysts .  IMPRESSION: 1. Multiple bilateral renal abscesses. 2. Right hydronephrosis and ureterectasis without calculus or evident obstructing mass.   Electronically Signed   By: Lucrezia Europe M.D.   On: 10/21/2014 20:51    Scheduled Meds: . cefTRIAXone (ROCEPHIN)  IV  1 g Intravenous Q24H  . enoxaparin (LOVENOX) injection  40 mg Subcutaneous Q24H   Continuous Infusions: . sodium chloride     Antibiotics Given (last 72 hours)    Date/Time Action Medication Dose Rate   10/22/14 0112 Given   vancomycin (VANCOCIN) 1,500 mg in sodium chloride 0.9 % 500 mL IVPB 1,500  mg 250 mL/hr   10/22/14 0445 Given   metroNIDAZOLE (FLAGYL) IVPB 500 mg 500 mg 100 mL/hr   10/22/14 1340 Given   vancomycin (VANCOCIN) 1,500 mg in sodium chloride 0.9 % 500 mL IVPB 1,500 mg 250 mL/hr   10/22/14 2154 Given   metroNIDAZOLE (FLAGYL) IVPB 500 mg 500 mg 100 mL/hr   10/22/14 2203 Given   cefTRIAXone (ROCEPHIN) 1 g in dextrose 5 % 50 mL IVPB 1 g 100 mL/hr   10/23/14 0108 Given   vancomycin (VANCOCIN) 1,500 mg in sodium chloride 0.9 % 500 mL IVPB 1,500 mg 250 mL/hr   10/23/14 5597 Given   metroNIDAZOLE (FLAGYL) IVPB 500 mg 500 mg 100 mL/hr      Active Problems:   Abscess, renal/perirenal   Hydronephrosis   Urinary retention   Sepsis   Renal abscess    Time spent: 25 min    Claudina Oliphant  [facility] Pager [phone]. If 7PM-7AM, please contact night-coverage at [email or web address], password [facility] 10/23/2014, 8:57 AM  LOS: 2 days

## 2014-10-23 NOTE — Consult Note (Signed)
Reason for Consult: Renal Abscesses / Pyelonephritis, Urinary Retention, Mild Right Hydronephrosis  Referring Physician: Gasper Lloyd Higgins  Andrew Higgins is an 39 y.o. male.   HPI:   1- Renal Abscesses / Pyelonephritis - pt with "UTI" diagnosed about 1 week ago and started Cipro course by PCP, no CX's obtianed. F/u UCX in urology office with Andrew Higgins on 3/29 negative for growth. Persistant / recurrent fever to 105 prompting repeat ER eval on 3/31 and CT with multifocal small renal abscesses. New BCX, UCX obtained, both of which are negative.  2 - Urinary Retention / Lower Urinary Tract Symptom - long h/o obstructive symptoms with urgency / freqeuncy and nocturnal incontinence x mos. PVR 658mL noted in ER 09/2014. No h/o straddle injury / GU trauma / LE weakness / spine injury. CT w/o obvious cored compression / mass. 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 DRE 35gm smooth / non-tender. Cysto 09/2014 w/o stricutre / obstructing lesions and passed fill + pull 3/29, but in clinical retention again tonight with 700cc PVR, foley now placed again. He does admit to some new "unilateral weakness" and "favoring one side for few weeks", denies known neurologic disease other than sensirnural hearing loss.   3 - Mild Right Hydronephrosis - mild rt hydro to somewhat distended bladder by ER CT 09/2014, no stones, masses. Recent urinary retention as per above.   PMH sig for neural hearing loss, bilateral inguinal hernia repair. NO CV disease. No blood thinners.   Today " Andrew Higgins  " is seen in consultation for above.    His PCP is Andrew Higgins with [facility].      Past Medical History  Diagnosis Date  . Hard of hearing     History reviewed. No pertinent past surgical history.  History reviewed. No pertinent family history.  Social History:  reports that he has never smoked. He does not have any smokeless tobacco history on file. He reports that he does not drink alcohol or use illicit  drugs.  Allergies:  Allergies  Allergen Reactions  . Phenobarbital Hives  . Fish Allergy Other (See Comments)    Tongue swelling, lethargy    Medications: I have reviewed the patient's current medications.  CBC Latest Ref Rng 10/23/2014 10/22/2014 10/21/2014  WBC 4.0 - 10.5 K/uL 10.7(H) 12.1(H) 9.9  Hemoglobin 13.0 - 17.0 g/dL 11.5(L) 11.7(L) 11.7(L)  Hematocrit 39.0 - 52.0 % 35.7(L) 35.5(L) 36.6(L)  Platelets 150 - 400 K/uL 267 249 234   CMP Latest Ref Rng 10/23/2014 10/22/2014 10/21/2014  Glucose 70 - 99 mg/dL 107(H) 105(H) 123(H)  BUN 6 - 23 mg/dL 9 9 11   Creatinine 0.50 - 1.35 mg/dL 1.18 1.16 1.20  Sodium 135 - 145 mmol/L 139 139 139  Potassium 3.5 - 5.1 mmol/L 4.1 4.1 4.1  Chloride 96 - 112 mmol/L 105 106 102  CO2 19 - 32 mmol/L 29 26 26   Calcium 8.4 - 10.5 mg/dL 8.4 8.3(L) 8.8  Total Protein 6.0 - 8.3 g/dL - - 6.7  Total Bilirubin 0.3 - 1.2 mg/dL - - 0.7  Alkaline Phos 39 - 117 U/L - - 63  AST 0 - 37 U/L - - 26  ALT 0 - 53 U/L - - 26      Ct Abdomen Pelvis W Contrast  10/21/2014   CLINICAL DATA:  7 days of fever, fatigue, back pain. Was seen at [facility] last week for same symptoms, dx'd with UTI. Patient reports that he was seen in February for urinary  retention and found to have an enlarged prostate. He was not treated for prostatitis at that time and was scheduled a follow-up appointment with urologist in April. Given doxazosin reports he has not been taking it. He reports frequent urinations, little production, suprapubic fullness, abdominal pain. He denies blood, discharge, painful urinations. He reports nausea, no vomiting. Mild frontal headache from time to time, no associated neuro symptoms. Patient reports he's been treating his fever with Tylenol, MAXIMUM TEMPERATURE 105.1. States he must take Tylenol around the clock or his temperature will spike. Patient denies neck stiffness, chest pain, shortness of breath, vomiting, lower extremity swelling.  EXAM: CT ABDOMEN AND PELVIS  WITH CONTRAST  TECHNIQUE: Multidetector CT imaging of the abdomen and pelvis was performed using the standard protocol following bolus administration of intravenous contrast.  CONTRAST:  5mL OMNIPAQUE IOHEXOL 300 MG/ML  SOLN  COMPARISON:  09/09/2009  FINDINGS: Minimal dependent atelectasis in the visualized lung bases. Unremarkable liver, nondilated gallbladder, spleen, adrenal glands, pancreas, abdominal aorta, portal vein.  New bilateral low attenuation renal lesions. Largest on the right in the lower pole measures approximately 9x28x 32 mm and is partially exophytic posteriorly, with inflammatory/ edematous changes in the adjacent retroperitoneal fat. Largest on the left medially measures 15x24x40 mm and extends adjacent to the psoas musculature. There are multiple additional smaller foci bilaterally, more extensive on the right than left. There are wedge-shaped areas of decreased enhancement in both kidneys in the regions of the focal lesions. There is right hydronephrosis and ureterectasis down to the ureteral orifice. Urinary bladder is distended with mild diffuse wall thickening with a crenulated contour.  Stomach, small bowel, and colon are nondilated. No ascites. No free air. No adenopathy. Small Schmorl's nodes at all levels T8-L4. Sacral Tarlov cysts .  IMPRESSION: 1. Multiple bilateral renal abscesses. 2. Right hydronephrosis and ureterectasis without calculus or evident obstructing mass.   Electronically Signed   By: Lucrezia Europe M.D.   On: 10/21/2014 20:51    Review of Systems  Unable to perform ROS :  Blood pressure 126/60, pulse 71, temperature 98.6 F (37 C), temperature source Oral, resp. rate 16, height 6\' 4"  (1.93 m), weight 174 lb 11.2 oz (79.243 kg), SpO2 97 %. Physical Exam  Constitutional: He appears well-developed.  HENT:  Head: Normocephalic.  Eyes: Pupils are equal, round, and reactive to light.  Neck: Normal range of motion.  Cardiovascular:  Regular tachycardia   Respiratory: Effort normal.  GI: Soft.  Genitourinary: Penis normal.  Foley c/d/i with 700cc urine after void  Musculoskeletal: Normal range of motion.  Neurological: He is alert.  Skin: Skin is warm.  Psychiatric: He has a normal mood and affect. Judgment and thought content normal.   Please note, this physical exam was from admission. Patient was in MRI but spoke with his mother.  Assessment/Plan:  1- Renal Abscesses / Pyelonephritis -impressive infection. Agree with admission with empiric broad spectrum IV ABX, will likely need several weeks of IV therapy. Challenge may be obtaining acurate CX data. May be good to try to contact [facility] to see if they have any recent positive culture data as CX from my office negative (after starting Cipro).  I do not feel any sort of permant renal drains / stents warranted as these abscesses are quite small and multifocal. Consider ID consultation for duration of ABX. Remains on ceftriaxone and has been afebrile since admission. WBC down from 12 yesterday to 10 day.   2 - Urinary Retention / Lower Urinary  Tract Symptom -  No lower tract stricture / obsruction by cysto. I am concerned there may be neurologic origin to this (MS?, other). I have reviewed the patient's CT with radiologist and no overt signs of cord compression noted. MRI of spine and brain pending today. Will make further recommendations about voiding/catheter plan when this is complete.  3 - Mild Right Hydronephrosis - Favor reflux from recent urinary retention given no stone / mass on imaging.   Enrika Aguado C 10/23/2014, 3:55 PM

## 2014-10-24 ENCOUNTER — Encounter (HOSPITAL_COMMUNITY): Payer: Self-pay | Admitting: Internal Medicine

## 2014-10-24 DIAGNOSIS — N1330 Unspecified hydronephrosis: Secondary | ICD-10-CM

## 2014-10-24 DIAGNOSIS — J634 Siderosis: Secondary | ICD-10-CM

## 2014-10-24 LAB — CBC
HEMATOCRIT: 36.4 % — AB (ref 39.0–52.0)
HEMOGLOBIN: 11.8 g/dL — AB (ref 13.0–17.0)
MCH: 32.2 pg (ref 26.0–34.0)
MCHC: 32.4 g/dL (ref 30.0–36.0)
MCV: 99.5 fL (ref 78.0–100.0)
Platelets: 303 10*3/uL (ref 150–400)
RBC: 3.66 MIL/uL — ABNORMAL LOW (ref 4.22–5.81)
RDW: 13.1 % (ref 11.5–15.5)
WBC: 10.5 10*3/uL (ref 4.0–10.5)

## 2014-10-24 LAB — IRON AND TIBC
Iron: 75 ug/dL (ref 42–165)
Saturation Ratios: 38 % (ref 20–55)
TIBC: 198 ug/dL — ABNORMAL LOW (ref 215–435)
UIBC: 123 ug/dL — AB (ref 125–400)

## 2014-10-24 LAB — BASIC METABOLIC PANEL
ANION GAP: 4 — AB (ref 5–15)
BUN: 10 mg/dL (ref 6–23)
CALCIUM: 8.7 mg/dL (ref 8.4–10.5)
CO2: 31 mmol/L (ref 19–32)
Chloride: 104 mmol/L (ref 96–112)
Creatinine, Ser: 1.1 mg/dL (ref 0.50–1.35)
GFR calc non Af Amer: 85 mL/min — ABNORMAL LOW (ref >90)
Glucose, Bld: 102 mg/dL — ABNORMAL HIGH (ref 70–99)
Potassium: 3.8 mmol/L (ref 3.5–5.1)
Sodium: 139 mmol/L (ref 135–145)

## 2014-10-24 LAB — FERRITIN: FERRITIN: 623 ng/mL — AB (ref 22–322)

## 2014-10-24 MED ORDER — POLYETHYLENE GLYCOL 3350 17 G PO PACK
17.0000 g | PACK | Freq: Every day | ORAL | Status: DC
  Administered 2014-10-24 – 2014-10-26 (×3): 17 g via ORAL
  Filled 2014-10-24 (×3): qty 1

## 2014-10-24 MED ORDER — SODIUM CHLORIDE 0.9 % IJ SOLN: 10.0000 mL | Freq: Two times a day (BID) | INTRAMUSCULAR | Status: DC

## 2014-10-24 MED ORDER — SODIUM CHLORIDE 0.9 % IJ SOLN
10.0000 mL | INTRAMUSCULAR | Status: DC | PRN
Start: 2014-10-24 — End: 2014-10-26
  Administered 2014-10-24: 10 mL
  Administered 2014-10-25: 20 mL
  Administered 2014-10-26: 10 mL
  Filled 2014-10-24 (×3): qty 40

## 2014-10-24 NOTE — Progress Notes (Signed)
Late entry:   Last night, patient complained of constipation.  NP on call notified; new orders received for miralax daily.  Patient refused last night's dose because he did not want to risk an accident and stated that he will begin taking it in the morning.

## 2014-10-24 NOTE — Progress Notes (Signed)
Peripherally Inserted Central Catheter/Midline Placement  The IV Nurse has discussed with the patient and/or persons authorized to consent for the patient, the purpose of this procedure and the potential benefits and risks involved with this procedure.  The benefits include less needle sticks, lab draws from the catheter and patient may be discharged home with the catheter.  Risks include, but not limited to, infection, bleeding, blood clot (thrombus formation), and puncture of an artery; nerve damage and irregular heat beat.  Alternatives to this procedure were also discussed.  PICC/Midline Placement Documentation  PICC / Midline Single Lumen 16/01/09 PICC Right Basilic 45 cm 0 cm (Active)  Indication for Insertion or Continuance of Line Home intravenous therapies (PICC only) 10/24/2014  9:28 AM  Exposed Catheter (cm) 0 cm 10/24/2014  9:28 AM  Site Assessment Clean;Dry;Intact 10/24/2014  9:28 AM  Line Status Flushed;Saline locked;Blood return noted 10/24/2014  9:28 AM  Dressing Type Transparent 10/24/2014  9:28 AM  Dressing Intervention New dressing 10/24/2014  9:28 AM  Dressing Change Due 10/31/14 10/24/2014  9:28 AM       [facility] Payment 10/24/2014, 9:29 AM

## 2014-10-24 NOTE — Progress Notes (Addendum)
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 Mild generalized cord atrophy -patient has hearing loss -repeat MRI of lumbar/thoracic area- patient thinks he can lay still If unable to lay still will need to be done under anesthesia -outpatient neuro follow up per neuro   Hydronephrosis- seen on CT scan of ABD and Pelvis   Urinary retention with AKI On Doxazocin Rx Foley Catheter placed in ED- will go home with foley Urology consulted  Sepsis- resolved   Code Status: full Family Communication: wife at bedside Disposition Plan:    Consultants:  urology  Procedures:      HPI/Subjective: Feeling well Minimal abd pain on left side  Objective: Filed Vitals:   10/24/14 0423  BP: 120/69  Pulse: 74  Temp: 98.9 F (37.2 C)  Resp: 16    Intake/Output Summary (Last 24 hours) at 10/24/14 1109 Last data filed at 10/24/14 0900  Gross per 24 hour  Intake   1200 ml  Output   6150 ml  Net  -4950 ml   Filed Weights   10/21/14 2258 10/23/14 0603 10/23/14 2101  Weight: 79.9 kg (176 lb 2.4 oz) 79.243 kg (174 lb 11.2 oz) 80.015 kg (176 lb 6.4 oz)    Exam:   General:  A+Ox3, NAD- hard of hearing  Cardiovascular: rrr  Respiratory: clear  Abdomen: +BS  Musculoskeletal: no edema  Data Reviewed: Basic Metabolic  Panel:  Recent Labs Lab 10/21/14 1850 10/22/14 0704 10/23/14 0542 10/24/14 0420  NA 139 139 139 139  K 4.1 4.1 4.1 3.8  CL 102 106 105 104  CO2 26 26 29 31   GLUCOSE 123* 105* 107* 102*  BUN 11 9 9 10   CREATININE 1.20 1.16 1.18 1.10  CALCIUM 8.8 8.3* 8.4 8.7   Liver Function Tests:  Recent Labs Lab 10/21/14 1850  AST 26  ALT 26  ALKPHOS 63  BILITOT 0.7  PROT 6.7  ALBUMIN 2.8*    Recent Labs Lab 10/21/14 1850  LIPASE 34   No results for input(s): AMMONIA in the last 168 hours. CBC:  Recent Labs Lab 10/21/14 1850 10/22/14 0704 10/23/14 0542 10/24/14 0420  WBC 9.9 12.1* 10.7* 10.5  NEUTROABS 7.5  --   --   --   HGB 11.7* 11.7* 11.5* 11.8*  HCT 36.6* 35.5* 35.7* 36.4*  MCV 101.1* 99.7 100.0 99.5  PLT 234 249 267 303   Cardiac Enzymes: No results for input(s): CKTOTAL, CKMB, CKMBINDEX, TROPONINI in the last 168 hours. BNP (last 3 results) No results for input(s): BNP in the last 8760 hours.  ProBNP (last 3 results) No results for input(s): PROBNP in the last 8760 hours.  CBG: No results for input(s): GLUCAP in the last 168 hours.  Recent Results (from the past 240 hour(s))  Urine culture     Status: None   Collection Time: 10/21/14  7:41 PM  Result Value Ref Range Status   Specimen Description URINE, CLEAN CATCH  Final   Special Requests NONE  Final   Colony Count NO GROWTH Performed at [facility]   Final  Culture NO GROWTH Performed at [facility]   Final   Report Status 10/31/14 FINAL  Final  Blood culture (routine x 2)     Status: None (Preliminary result)   Collection Time: 10/21/14 10:15 PM  Result Value Ref Range Status   Specimen Description BLOOD LEFT ANTECUBITAL  Final   Special Requests BOTTLES DRAWN AEROBIC AND ANAEROBIC 5CC  Final   Culture   Final           BLOOD CULTURE RECEIVED NO GROWTH TO DATE CULTURE WILL BE HELD FOR 5 DAYS BEFORE ISSUING A FINAL NEGATIVE REPORT Performed at [facility]     Report Status PENDING  Incomplete  Blood culture (routine x 2)     Status: None (Preliminary result)   Collection Time: 10/21/14 10:22 PM  Result Value Ref Range Status   Specimen Description BLOOD LEFT WRIST  Final   Special Requests BOTTLES DRAWN AEROBIC AND ANAEROBIC 3CC  Final   Culture   Final           BLOOD CULTURE RECEIVED NO GROWTH TO DATE CULTURE WILL BE HELD FOR 5 DAYS BEFORE ISSUING A FINAL NEGATIVE REPORT Performed at [facility]    Report Status PENDING  Incomplete     Studies: Mr Jeri Cos Wo Contrast  10/31/2014   CLINICAL DATA:  Fevers and chills, 7 days duration. Loss of urinary control.  EXAM: MRI HEAD WITHOUT AND WITH CONTRAST  MRI CERVICAL SPINE WITHOUT AND WITH CONTRAST  MRI thoracic spine without and with contrast  TECHNIQUE: Multiplanar, multiecho pulse sequences of the brain and surrounding structures, and cervical spine, to include the craniocervical junction and cervicothoracic junction, were obtained without and with intravenous contrast.  CONTRAST:  66mL MULTIHANCE GADOBENATE DIMEGLUMINE 529 MG/ML IV SOLN  COMPARISON:  None.  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 Hemosiderin codes the surface of the upper spinal cord, the cerebellum an the basilar structures of the brain. There is pronounced cerebellar atrophy. No ischemic change affecting the cerebral hemispheres. No evidence of mass lesion, hydrocephalus or extra-axial collection. Venous sinuses are patent. Arterial structures are patent. After contrast administration, no abnormal enhancement occurs. Small insignificant venous angioma in the right parietal white matter without evidence of previous hemorrhage.  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 Mild generalized cord atrophy. Ordinary spondylosis at C3-4, C5-6 and C6-7 with narrowing of the ventral subarachnoid space but no cord compression  or significant foraminal compromise. No evidence of primary vascular lesion in the cervical region. No evidence of dural pathology. Scans are rather significantly motion degraded but I do not think we would miss a large dural leak or lesion.  MRI thoracic spine findings  Superficial siderosis affecting the entire thoracic spinal cord. Mild generalized cord atrophy. Ordinary mild thoracic degenerative disease. No compressive stenosis. No evidence of dural pathology, though there is considerable motion degradation in this region is well. Question abnormal vessels at the thoracolumbar junction region. Arteriovenous malformation not excluded on this motion degraded exam.  IMPRESSION: MRI head:  Advanced superficial siderosis with cerebellar atrophy.  MRI cervical spine: Superficial siderosis. No dural lesion evident in the cervical region. Considerable motion degradation however.  MRI thoracic spine: Superficial siderosis. Considerable motion degradation here is well. No dural lesion identified. Some question of abnormal vessels at the thoracolumbar junction. Arteriovenous malformation not excluded.  In order to diagnose the most usual causes of CNS  siderosis, we are going to need imaging without motion. This may require sedation. I would at least re- exam in the lower thoracic spine and the lumbar spine looking for evidence of dural deficiencies or leaks or, in this case, possible arteriovenous malformation.   Electronically Signed   By: Nelson Chimes M.D.   On: 10/23/2014 17:22   Mr Cervical Spine W Wo Contrast  10/23/2014   CLINICAL DATA:  Fevers and chills, 7 days duration. Loss of urinary control.  EXAM: MRI HEAD WITHOUT AND WITH CONTRAST  MRI CERVICAL SPINE WITHOUT AND WITH CONTRAST  MRI thoracic spine without and with contrast  TECHNIQUE: Multiplanar, multiecho pulse sequences of the brain and surrounding structures, and cervical spine, to include the craniocervical junction and cervicothoracic junction, were  obtained without and with intravenous contrast.  CONTRAST:  34mL MULTIHANCE GADOBENATE DIMEGLUMINE 529 MG/ML IV SOLN  COMPARISON:  None.  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 Hemosiderin codes the surface of the upper spinal cord, the cerebellum an the basilar structures of the brain. There is pronounced cerebellar atrophy. No ischemic change affecting the cerebral hemispheres. No evidence of mass lesion, hydrocephalus or extra-axial collection. Venous sinuses are patent. Arterial structures are patent. After contrast administration, no abnormal enhancement occurs. Small insignificant venous angioma in the right parietal white matter without evidence of previous hemorrhage.  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 Mild generalized cord atrophy. Ordinary spondylosis at C3-4, C5-6 and C6-7 with narrowing of the ventral subarachnoid space but no cord compression or significant foraminal compromise. No evidence of primary vascular lesion in the cervical region. No evidence of dural pathology. Scans are rather significantly motion degraded but I do not think we would miss a large dural leak or lesion.  MRI thoracic spine findings  Superficial siderosis affecting the entire thoracic spinal cord. Mild generalized cord atrophy. Ordinary mild thoracic degenerative disease. No compressive stenosis. No evidence of dural pathology, though there is considerable motion degradation in this region is well. Question abnormal vessels at the thoracolumbar junction region. Arteriovenous malformation not excluded on this motion degraded exam.  IMPRESSION: MRI head:  Advanced superficial siderosis with cerebellar atrophy.  MRI cervical spine: Superficial siderosis. No dural lesion evident in the cervical region. Considerable motion degradation however.  MRI thoracic spine: Superficial siderosis. Considerable motion  degradation here is well. No dural lesion identified. Some question of abnormal vessels at the thoracolumbar junction. Arteriovenous malformation not excluded.  In order to diagnose the most usual causes of CNS siderosis, we are going to need imaging without motion. This may require sedation. I would at least re- exam in the lower thoracic spine and the lumbar spine looking for evidence of dural deficiencies or leaks or, in this case, possible arteriovenous malformation.   Electronically Signed   By: Nelson Chimes M.D.   On: 10/23/2014 17:22   Mr Thoracic Spine W Wo Contrast  10/23/2014   CLINICAL DATA:  Fevers and chills, 7 days duration. Loss of urinary control.  EXAM: MRI HEAD WITHOUT AND WITH CONTRAST  MRI CERVICAL SPINE WITHOUT AND WITH CONTRAST  MRI thoracic spine without and with contrast  TECHNIQUE: Multiplanar, multiecho pulse sequences of the brain and surrounding structures, and cervical spine, to include the craniocervical junction and cervicothoracic junction, were obtained without and with intravenous contrast.  CONTRAST:  78mL MULTIHANCE GADOBENATE DIMEGLUMINE 529 MG/ML IV SOLN  COMPARISON:  None.  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 Hemosiderin codes the surface of the upper spinal cord, the cerebellum an the basilar structures of the brain. There is pronounced cerebellar atrophy. No ischemic change affecting the cerebral hemispheres. No evidence of mass lesion, hydrocephalus or extra-axial collection. Venous sinuses are patent. Arterial structures are patent. After contrast administration, no abnormal enhancement occurs. Small insignificant venous angioma in the right parietal white matter without evidence of previous hemorrhage.  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 Mild generalized cord atrophy. Ordinary spondylosis at C3-4, C5-6 and C6-7 with narrowing of the ventral  subarachnoid space but no cord compression or significant foraminal compromise. No evidence of primary vascular lesion in the cervical region. No evidence of dural pathology. Scans are rather significantly motion degraded but I do not think we would miss a large dural leak or lesion.  MRI thoracic spine findings  Superficial siderosis affecting the entire thoracic spinal cord. Mild generalized cord atrophy. Ordinary mild thoracic degenerative disease. No compressive stenosis. No evidence of dural pathology, though there is considerable motion degradation in this region is well. Question abnormal vessels at the thoracolumbar junction region. Arteriovenous malformation not excluded on this motion degraded exam.  IMPRESSION: MRI head:  Advanced superficial siderosis with cerebellar atrophy.  MRI cervical spine: Superficial siderosis. No dural lesion evident in the cervical region. Considerable motion degradation however.  MRI thoracic spine: Superficial siderosis. Considerable motion degradation here is well. No dural lesion identified. Some question of abnormal vessels at the thoracolumbar junction. Arteriovenous malformation not excluded.  In order to diagnose the most usual causes of CNS siderosis, we are going to need imaging without motion. This may require sedation. I would at least re- exam in the lower thoracic spine and the lumbar spine looking for evidence of dural deficiencies or leaks or, in this case, possible arteriovenous malformation.   Electronically Signed   By: Nelson Chimes M.D.   On: 10/23/2014 17:22    Scheduled Meds: . cefTRIAXone (ROCEPHIN)  IV  1 g Intravenous Q24H  . enoxaparin (LOVENOX) injection  40 mg Subcutaneous Q24H  . polyethylene glycol  17 g Oral Daily  . sodium chloride  10-40 mL Intracatheter Q12H   Continuous Infusions:   Antibiotics Given (last 72 hours)    Date/Time Action Medication Dose Rate   10/22/14 0112 Given   vancomycin (VANCOCIN) 1,500 mg in sodium chloride  0.9 % 500 mL IVPB 1,500 mg 250 mL/hr   10/22/14 0445 Given   metroNIDAZOLE (FLAGYL) IVPB 500 mg 500 mg 100 mL/hr   10/22/14 1340 Given   vancomycin (VANCOCIN) 1,500 mg in sodium chloride 0.9 % 500 mL IVPB 1,500 mg 250 mL/hr   10/22/14 2154 Given   metroNIDAZOLE (FLAGYL) IVPB 500 mg 500 mg 100 mL/hr   10/22/14 2203 Given   cefTRIAXone (ROCEPHIN) 1 g in dextrose 5 % 50 mL IVPB 1 g 100 mL/hr   10/23/14 0108 Given   vancomycin (VANCOCIN) 1,500 mg in sodium chloride 0.9 % 500 mL IVPB 1,500 mg 250 mL/hr   10/23/14 5427 Given   metroNIDAZOLE (FLAGYL) IVPB 500 mg 500 mg 100 mL/hr   10/23/14 2037 Given   cefTRIAXone (ROCEPHIN) 1 g in dextrose 5 % 50 mL IVPB 1 g 100 mL/hr      Active Problems:   Abscess, renal/perirenal   Hydronephrosis   Urinary retention   Sepsis   Renal abscess    Time spent:  25 min    Andrew Higgins  [facility] Pager [phone]. If 7PM-7AM, please contact night-coverage at [email or web address], password [facility] 10/24/2014, 11:09 AM  LOS: 3 days

## 2014-10-25 ENCOUNTER — Inpatient Hospital Stay (HOSPITAL_COMMUNITY): Payer: 59

## 2014-10-25 DIAGNOSIS — D432 Neoplasm of uncertain behavior of brain, unspecified: Secondary | ICD-10-CM | POA: Diagnosis present

## 2014-10-25 MED ORDER — GADOBENATE DIMEGLUMINE 529 MG/ML IV SOLN
20.0000 mL | Freq: Once | INTRAVENOUS | Status: AC | PRN
  Administered 2014-10-25: 17 mL via INTRAVENOUS

## 2014-10-25 NOTE — Progress Notes (Signed)
PROGRESS NOTE  Andrew Higgins EQA:[id number] DOB: [birth year] DOA: 10/21/2014 PCP: [name]  Assessment/Plan: Abscess, renal/perirenal Blood and Urine cultures sent-  NGTD Dr. 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  Discussed with urology.  Also discussed with Dr. 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 Mild generalized cord atrophy -patient has hearing loss See above. Discussed with Dr. Eliseo Squires. She spoke to Dr. Doy Mince prior to lumbar MRI back, who recommended outpt neuro follow up.   Sepsis- resolved   Code Status: full Family Communication: wife at bedside Disposition Plan:    Consultants:  urology  Procedures:      HPI/Subjective: No complaints. Reports left leg sometimes feels weaker than right while cycline  Objective: Filed Vitals:   10/25/14 0938  BP: 100/61  Pulse: 93  Temp: 98.7 F (37.1 C)  Resp: 16    Intake/Output Summary (Last 24 hours) at 10/25/14 1402 Last data filed at 10/25/14 0939  Gross per 24 hour  Intake    720 ml  Output   7400 ml  Net  -6680 ml   Filed Weights   10/23/14 0603 10/23/14 2101 10/24/14 2237  Weight: 79.243 kg (174 lb 11.2 oz) 80.015 kg (176 lb 6.4  oz) 80.6 kg (177 lb 11.1 oz)    Exam:   General:  Asleep. Arousable. HOH  Cardiovascular: rrr without MGR  Respiratory: clear without WRR  Abdomen: +BS s, nt, nd  Musculoskeletal: no edema  Neuro: motor 5/5 throughout  Data Reviewed: Basic Metabolic Panel:  Recent Labs Lab 10/21/14 1850 10/22/14 0704 10/23/14 0542 10/24/14 0420  NA 139 139 139 139  K 4.1 4.1 4.1 3.8  CL 102 106 105 104  CO2 26 26 29 31   GLUCOSE 123* 105* 107* 102*  BUN 11 9 9 10   CREATININE 1.20 1.16 1.18 1.10  CALCIUM 8.8 8.3* 8.4 8.7   Liver Function Tests:  Recent Labs Lab 10/21/14 1850  AST 26  ALT 26  ALKPHOS 63  BILITOT 0.7  PROT 6.7  ALBUMIN 2.8*    Recent Labs Lab 10/21/14 1850  LIPASE 34   No results for input(s): AMMONIA in the last 168 hours. CBC:  Recent Labs Lab 10/21/14 1850 10/22/14 0704 10/23/14 0542 10/24/14 0420  WBC 9.9 12.1* 10.7* 10.5  NEUTROABS 7.5  --   --   --   HGB 11.7* 11.7* 11.5* 11.8*  HCT 36.6* 35.5* 35.7* 36.4*  MCV 101.1* 99.7 100.0 99.5  PLT 234 249 267 303   Cardiac Enzymes: No results for input(s): CKTOTAL, CKMB, CKMBINDEX, TROPONINI in the last 168 hours. BNP (last 3 results) No results for input(s): BNP in the last 8760 hours.  ProBNP (last 3 results) No results for input(s): PROBNP in the last 8760 hours.  CBG: No results for input(s): GLUCAP in the last 168 hours.  Recent Results (from the past 240 hour(s))  Urine culture  Status: None   Collection Time: 10/21/14  7:41 PM  Result Value Ref Range Status   Specimen Description URINE, CLEAN CATCH  Final   Special Requests NONE  Final   Colony Count NO GROWTH Performed at [facility]   Final   Culture NO GROWTH Performed at [facility]   Final   Report Status 2014-11-21 FINAL  Final  Blood culture (routine x 2)     Status: None (Preliminary result)   Collection Time: 10/21/14 10:15 PM  Result Value Ref Range Status   Specimen Description BLOOD LEFT  ANTECUBITAL  Final   Special Requests BOTTLES DRAWN AEROBIC AND ANAEROBIC 5CC  Final   Culture   Final           BLOOD CULTURE RECEIVED NO GROWTH TO DATE CULTURE WILL BE HELD FOR 5 DAYS BEFORE ISSUING A FINAL NEGATIVE REPORT Performed at [facility]    Report Status PENDING  Incomplete  Blood culture (routine x 2)     Status: None (Preliminary result)   Collection Time: 10/21/14 10:22 PM  Result Value Ref Range Status   Specimen Description BLOOD LEFT WRIST  Final   Special Requests BOTTLES DRAWN AEROBIC AND ANAEROBIC 3CC  Final   Culture   Final           BLOOD CULTURE RECEIVED NO GROWTH TO DATE CULTURE WILL BE HELD FOR 5 DAYS BEFORE ISSUING A FINAL NEGATIVE REPORT Performed at [facility]    Report Status PENDING  Incomplete     Studies: Mr Jeri Cos Wo Contrast  11/21/2014   CLINICAL DATA:  Fevers and chills, 7 days duration. Loss of urinary control.  EXAM: MRI HEAD WITHOUT AND WITH CONTRAST  MRI CERVICAL SPINE WITHOUT AND WITH CONTRAST  MRI thoracic spine without and with contrast  TECHNIQUE: Multiplanar, multiecho pulse sequences of the brain and surrounding structures, and cervical spine, to include the craniocervical junction and cervicothoracic junction, were obtained without and with intravenous contrast.  CONTRAST:  28mL MULTIHANCE GADOBENATE DIMEGLUMINE 529 MG/ML IV SOLN  COMPARISON:  None.  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 Hemosiderin codes the surface of the upper spinal cord, the cerebellum an the basilar structures of the brain. There is pronounced cerebellar atrophy. No ischemic change affecting the cerebral hemispheres. No evidence of mass lesion, hydrocephalus or extra-axial collection. Venous sinuses are patent. Arterial structures are patent. After contrast administration, no abnormal enhancement occurs. Small insignificant venous angioma in the right parietal white matter without evidence  of previous hemorrhage.  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 Mild generalized cord atrophy. Ordinary spondylosis at C3-4, C5-6 and C6-7 with narrowing of the ventral subarachnoid space but no cord compression or significant foraminal compromise. No evidence of primary vascular lesion in the cervical region. No evidence of dural pathology. Scans are rather significantly motion degraded but I do not think we would miss a large dural leak or lesion.  MRI thoracic spine findings  Superficial siderosis affecting the entire thoracic spinal cord. Mild generalized cord atrophy. Ordinary mild thoracic degenerative disease. No compressive stenosis. No evidence of dural pathology, though there is considerable motion degradation in this region is well. Question abnormal vessels at the thoracolumbar junction region. Arteriovenous malformation not excluded on this motion degraded exam.  IMPRESSION: MRI head:  Advanced superficial siderosis with cerebellar atrophy.  MRI cervical spine: Superficial siderosis. No dural lesion evident  in the cervical region. Considerable motion degradation however.  MRI thoracic spine: Superficial siderosis. Considerable motion degradation here is well. No dural lesion identified. Some question of abnormal vessels at the thoracolumbar junction. Arteriovenous malformation not excluded.  In order to diagnose the most usual causes of CNS siderosis, we are going to need imaging without motion. This may require sedation. I would at least re- exam in the lower thoracic spine and the lumbar spine looking for evidence of dural deficiencies or leaks or, in this case, possible arteriovenous malformation.   Electronically Signed   By: Nelson Chimes M.D.   On: 10/23/2014 17:22   Mr Cervical Spine W Wo Contrast  10/23/2014   CLINICAL DATA:  Fevers and chills, 7 days duration. Loss of urinary control.  EXAM: MRI HEAD WITHOUT AND WITH CONTRAST  MRI CERVICAL  SPINE WITHOUT AND WITH CONTRAST  MRI thoracic spine without and with contrast  TECHNIQUE: Multiplanar, multiecho pulse sequences of the brain and surrounding structures, and cervical spine, to include the craniocervical junction and cervicothoracic junction, were obtained without and with intravenous contrast.  CONTRAST:  49mL MULTIHANCE GADOBENATE DIMEGLUMINE 529 MG/ML IV SOLN  COMPARISON:  None.  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 Hemosiderin codes the surface of the upper spinal cord, the cerebellum an the basilar structures of the brain. There is pronounced cerebellar atrophy. No ischemic change affecting the cerebral hemispheres. No evidence of mass lesion, hydrocephalus or extra-axial collection. Venous sinuses are patent. Arterial structures are patent. After contrast administration, no abnormal enhancement occurs. Small insignificant venous angioma in the right parietal white matter without evidence of previous hemorrhage.  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 Mild generalized cord atrophy. Ordinary spondylosis at C3-4, C5-6 and C6-7 with narrowing of the ventral subarachnoid space but no cord compression or significant foraminal compromise. No evidence of primary vascular lesion in the cervical region. No evidence of dural pathology. Scans are rather significantly motion degraded but I do not think we would miss a large dural leak or lesion.  MRI thoracic spine findings  Superficial siderosis affecting the entire thoracic spinal cord. Mild generalized cord atrophy. Ordinary mild thoracic degenerative disease. No compressive stenosis. No evidence of dural pathology, though there is considerable motion degradation in this region is well. Question abnormal vessels at the thoracolumbar junction region. Arteriovenous malformation not excluded on this motion degraded exam.  IMPRESSION:  MRI head:  Advanced superficial siderosis with cerebellar atrophy.  MRI cervical spine: Superficial siderosis. No dural lesion evident in the cervical region. Considerable motion degradation however.  MRI thoracic spine: Superficial siderosis. Considerable motion degradation here is well. No dural lesion identified. Some question of abnormal vessels at the thoracolumbar junction. Arteriovenous malformation not excluded.  In order to diagnose the most usual causes of CNS siderosis, we are going to need imaging without motion. This may require sedation. I would at least re- exam in the lower thoracic spine and the lumbar spine looking for evidence of dural deficiencies or leaks or, in this case, possible arteriovenous malformation.   Electronically Signed   By: Nelson Chimes M.D.   On: 10/23/2014 17:22   Mr Thoracic Spine W Wo Contrast  10/25/2014   CLINICAL DATA:  Superficial siderosis of the central nervous system. Equivocal abnormality at the lower thoracic region on the previous motion degraded exam.  EXAM: MRI THORACIC AND LUMBAR SPINE WITHOUT AND WITH CONTRAST  TECHNIQUE: Multiplanar  and multiecho pulse sequences of the thoracic and lumbar spine were obtained without and with intravenous contrast.  CONTRAST:  67mL MULTIHANCE GADOBENATE DIMEGLUMINE 529 MG/ML IV SOLN  COMPARISON:  10/23/2014  FINDINGS: MR THORACIC SPINE FINDINGS  Study again shows hemosiderin deposition along the surface of the entire thoracic cord. There is probably some generalized cord atrophy. There is no significant degenerative disease in the thoracic spine. There is no spinal stenosis. No mass or vascular lesion is identified at T12 or above. The extensive abnormality in the lumbar region will be described in at examination. Incidental note is made of a benign bone cyst in the right lateral mass of T5, not significant.  MR LUMBAR SPINE FINDINGS  The conus tip ends at the L1 level. Beginning at L1 and extending all the way through lumbar  and sacral spinal canal, there is a space-occupying abnormality at displaces the nerve roots laterally for the most part. Towards the inferior aspect, some of the nerve roots extent more towards the middle of the abnormality. The abnormality shows CSF intensity before contrast administration. After contrast administration, there is smudgy contrast enhancement along the margins and internally. 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 I did consider whether this could represent a combination of arachnoid cysts and chronic arachnoiditis related to recurrent hemorrhage, but think that is less likely. Myxopapillary ependymomas are known to be associated with chronic recurrent hemorrhage and siderosis.  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 These lesions are known to be associated with recurrent hemorrhage and siderosis. I do think that it is possible that this could be a combination of arachnoid cysts and arachnoiditis related to previous hemorrhage, but think that explanation is less likely.   Electronically Signed   By: Nelson Chimes M.D.   On: 10/25/2014 11:21   Mr Thoracic Spine W Wo Contrast  10/23/2014   CLINICAL DATA:  Fevers and chills, 7 days duration. Loss of urinary control.  EXAM: MRI HEAD WITHOUT AND WITH CONTRAST  MRI CERVICAL SPINE WITHOUT AND WITH CONTRAST  MRI thoracic spine without and with contrast  TECHNIQUE: Multiplanar, multiecho pulse sequences of the brain and surrounding structures, and cervical spine, to include the craniocervical junction and cervicothoracic junction, were obtained without and with intravenous contrast.  CONTRAST:  32mL MULTIHANCE GADOBENATE DIMEGLUMINE 529 MG/ML IV SOLN  COMPARISON:  None.  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  Hemosiderin codes the surface of the upper spinal cord, the cerebellum an the basilar structures of the brain. There is pronounced cerebellar atrophy. No ischemic change affecting the cerebral hemispheres. No evidence of mass lesion, hydrocephalus or extra-axial collection. Venous sinuses are patent. Arterial structures are patent. After contrast administration, no abnormal enhancement occurs. Small insignificant venous angioma in the right parietal white matter without evidence of previous hemorrhage.  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 Mild generalized cord atrophy. Ordinary spondylosis at C3-4, C5-6 and C6-7 with narrowing of the ventral subarachnoid space but no cord compression or significant foraminal compromise. No evidence of primary vascular lesion in the cervical region. No evidence of dural pathology. Scans are rather significantly motion degraded but I do not think we would miss a large dural leak or lesion.  MRI thoracic spine findings  Superficial siderosis affecting the entire thoracic spinal cord. Mild generalized cord  atrophy. Ordinary mild thoracic degenerative disease. No compressive stenosis. No evidence of dural pathology, though there is considerable motion degradation in this region is well. Question abnormal vessels at the thoracolumbar junction region. Arteriovenous malformation not excluded on this motion degraded exam.  IMPRESSION: MRI head:  Advanced superficial siderosis with cerebellar atrophy.  MRI cervical spine: Superficial siderosis. No dural lesion evident in the cervical region. Considerable motion degradation however.  MRI thoracic spine: Superficial siderosis. Considerable motion degradation here is well. No dural lesion identified. Some question of abnormal vessels at the thoracolumbar junction. Arteriovenous malformation not excluded.  In order to diagnose the most usual causes of CNS siderosis, we are going to need  imaging without motion. This may require sedation. I would at least re- exam in the lower thoracic spine and the lumbar spine looking for evidence of dural deficiencies or leaks or, in this case, possible arteriovenous malformation.   Electronically Signed   By: Nelson Chimes M.D.   On: 10/23/2014 17:22   Mr Lumbar Spine W Wo Contrast  10/25/2014   CLINICAL DATA:  Superficial siderosis of the central nervous system. Equivocal abnormality at the lower thoracic region on the previous motion degraded exam.  EXAM: MRI THORACIC AND LUMBAR SPINE WITHOUT AND WITH CONTRAST  TECHNIQUE: Multiplanar and multiecho pulse sequences of the thoracic and lumbar spine were obtained without and with intravenous contrast.  CONTRAST:  69mL MULTIHANCE GADOBENATE DIMEGLUMINE 529 MG/ML IV SOLN  COMPARISON:  10/23/2014  FINDINGS: MR THORACIC SPINE FINDINGS  Study again shows hemosiderin deposition along the surface of the entire thoracic cord. There is probably some generalized cord atrophy. There is no significant degenerative disease in the thoracic spine. There is no spinal stenosis. No mass or vascular lesion is identified at T12 or above. The extensive abnormality in the lumbar region will be described in at examination. Incidental note is made of a benign bone cyst in the right lateral mass of T5, not significant.  MR LUMBAR SPINE FINDINGS  The conus tip ends at the L1 level. Beginning at L1 and extending all the way through lumbar and sacral spinal canal, there is a space-occupying abnormality at displaces the nerve roots laterally for the most part. Towards the inferior aspect, some of the nerve roots extent more towards the middle of the abnormality. The abnormality shows CSF intensity before contrast administration. After contrast administration, there is smudgy contrast enhancement along the margins and internally. 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 I did  consider whether this could represent a combination of arachnoid cysts and chronic arachnoiditis related to recurrent hemorrhage, but think that is less likely. Myxopapillary ependymomas are known to be associated with chronic recurrent hemorrhage and siderosis.  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 These lesions are known to be associated with recurrent hemorrhage and siderosis. I do think that it is possible that this could be a combination of arachnoid cysts and arachnoiditis related to previous hemorrhage, but think that explanation is less likely.   Electronically Signed   By: Nelson Chimes M.D.   On: 10/25/2014 11:21    Scheduled Meds: . cefTRIAXone (ROCEPHIN)  IV  1 g Intravenous Q24H  . enoxaparin (LOVENOX) injection  40 mg Subcutaneous Q24H  . polyethylene glycol  17 g Oral Daily  . sodium chloride  10-40 mL Intracatheter Q12H   Continuous Infusions:   Antibiotics Given (last 72  hours)    Date/Time Action Medication Dose Rate   10/22/14 2154 Given   metroNIDAZOLE (FLAGYL) IVPB 500 mg 500 mg 100 mL/hr   10/22/14 2203 Given   cefTRIAXone (ROCEPHIN) 1 g in dextrose 5 % 50 mL IVPB 1 g 100 mL/hr   10/23/14 0108 Given   vancomycin (VANCOCIN) 1,500 mg in sodium chloride 0.9 % 500 mL IVPB 1,500 mg 250 mL/hr   10/23/14 0658 Given   metroNIDAZOLE (FLAGYL) IVPB 500 mg 500 mg 100 mL/hr   10/23/14 2037 Given   cefTRIAXone (ROCEPHIN) 1 g in dextrose 5 % 50 mL IVPB 1 g 100 mL/hr   10/24/14 2134 Given   cefTRIAXone (ROCEPHIN) 1 g in dextrose 5 % 50 mL IVPB 1 g 100 mL/hr     Time spent: 25 min  [facility] [email or web address], password [facility] 10/25/2014, 2:02 PM  LOS: 4 days

## 2014-10-25 NOTE — Progress Notes (Signed)
Patient was down in MRI when we rounded this am. No changes in plan from urologic standpoint. Will continue to follow.

## 2014-10-26 DIAGNOSIS — D432 Neoplasm of uncertain behavior of brain, unspecified: Secondary | ICD-10-CM

## 2014-10-26 MED ORDER — CEFTRIAXONE SODIUM 1 G IJ SOLR
1.0000 g | INTRAMUSCULAR | Status: DC
  Administered 2014-10-26: 1 g via INTRAVENOUS
  Filled 2014-10-26: qty 10

## 2014-10-26 MED ORDER — HEPARIN SOD (PORK) LOCK FLUSH 100 UNIT/ML IV SOLN
250.0000 [IU] | INTRAVENOUS | Status: AC | PRN
  Administered 2014-10-26: 250 [IU]

## 2014-10-26 MED ORDER — DEXTROSE 5 % IV SOLN: 1.0000 g | INTRAVENOUS | Status: DC

## 2014-10-26 NOTE — Discharge Summary (Signed)
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 male with No Significant Medical history who presents to the ED with complaints of persistent fevers and chills despite therapy with Ciprofloxacin for a UTI for 7 days. He reports that his symptoms began in late February and he had Left Flank Pain and had problems with his urinary stream. His PCP placed on Doxazosin And made a referral to Urology for April . He had increased problems with dysuria and urinary retention as well and went to the ED 1 week ago. He was evaluated and a foley cahteter was placed In the ED, and then he was seen by Urology Dr. Tresa Moore 1 day ago and had a Cystoscopy performed in the office and his foley catheter was removed. He came to the ED the next day due to fevers to 103.5 at home, and chills, and dysuria, and urinary retention. Blood and Urine Cultures were ordered, and he was placed on IV antibiotics, and Urology ws consulted. CT showed multiple bilateral renal abscesses and right sided hydronephrosis.  Hospital Course:   Abscess, renal/perirenal with  sepsis Blood and Urine cultures sent no growth, had been on abx pta. Dr. Eliseo Squires spoke with ID- rocephin x 2 weeks and follow up CT Scan for resolution of abscesses Urology consulted -PICC line in for home IV antibiotics  Hydronephrosis- seen on CT scan of ABD and Pelvis. 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 explains chronic urinary retention  Lumbosacral mass: See above. Dr. Joya Salm consulted and will discuss biopsy/resection once infection cleared.  Pt to follow up in the office  Procedures:  none  Consultations:  Urology  neurosurgery  Discharge Exam: Filed Vitals:   10/26/14 0805  BP: 106/79  Pulse: 94  Temp: 98.9 F (37.2 C)  Resp: 17    General: tearful Cardiovascular: RRR Respiratory: CTA  Discharge Instructions   Discharge Instructions    Activity as tolerated - No restrictions    Complete by:  As directed      Diet general    Complete by:  As directed           Current Discharge Medication List    START taking these medications   Details  cefTRIAXone 1 g in dextrose 5 % 50 mL Inject 1 g into the vein daily. Through 11/05/14 Qty: 14 Syringe, Refills: 0      CONTINUE these medications which have NOT CHANGED   Details  acetaminophen (TYLENOL) 500 MG tablet  Take 500 mg by mouth every 6 (six) hours as needed for moderate pain.      STOP taking these medications     ciprofloxacin (CIPRO) 500 MG tablet      amoxicillin-clavulanate (AUGMENTIN) 875-125 MG per tablet       HYDROcodone-acetaminophen (NORCO/VICODIN) 5-325 MG per tablet      ondansetron (ZOFRAN ODT) 8 MG disintegrating tablet        Allergies  Allergen Reactions  . Phenobarbital Hives  . Fish Allergy Other (See Comments)    Tongue swelling, lethargy   Follow-up Information    Follow up with Alexis Frock, MD.   Specialty:  Urology   Contact information:   [place] [address] [phone]       Follow up with Floyce Stakes, MD.   Specialty:  Neurosurgery   Why:  after kidney infection resolved   Contact information:   [address] [place] [address] [phone]        The results of significant diagnostics from this hospitalization (including imaging, microbiology, ancillary and laboratory) are listed below for reference.    Significant Diagnostic Studies: Dg Chest 2 View  10/15/2014   CLINICAL DATA:  Fever, headache cough, dizziness  EXAM: CHEST  2 VIEW  COMPARISON:  None.  FINDINGS: Cardiomediastinal silhouette is unremarkable. No acute infiltrate or pleural effusion. No pulmonary edema. Bony thorax is unremarkable.  IMPRESSION: No active cardiopulmonary disease.   Electronically Signed   By: Lahoma Crocker M.D.   On: 10/15/2014 13:50   Mr Jeri Cos TF Contrast  10/23/2014   CLINICAL DATA:  Fevers and chills, 7 days duration. Loss of urinary control.  EXAM: MRI HEAD WITHOUT AND WITH CONTRAST  MRI CERVICAL SPINE WITHOUT AND WITH CONTRAST  MRI thoracic spine without and with contrast  TECHNIQUE: Multiplanar, multiecho pulse sequences of the brain and surrounding structures, and cervical spine, to include the craniocervical junction and cervicothoracic junction, were obtained without and with intravenous contrast.  CONTRAST:  20mL MULTIHANCE GADOBENATE DIMEGLUMINE 529 MG/ML IV SOLN  COMPARISON:  None.  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 Hemosiderin codes the surface of the upper  spinal cord, the cerebellum an the basilar structures of the brain. There is pronounced cerebellar atrophy. No ischemic change affecting the cerebral hemispheres. No evidence of mass lesion, hydrocephalus or extra-axial collection. Venous sinuses are patent. Arterial structures are patent. After contrast administration, no abnormal enhancement occurs. Small insignificant venous angioma in the right parietal white matter without evidence of previous hemorrhage.  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 Mild generalized cord atrophy. Ordinary spondylosis at C3-4, C5-6 and C6-7 with narrowing of the ventral subarachnoid space but no cord compression or significant foraminal compromise. No evidence of primary vascular lesion in the cervical region. No evidence of dural pathology. Scans are rather significantly motion degraded but I do not think we would miss a large dural leak or lesion.  MRI thoracic spine findings  Superficial siderosis affecting the entire thoracic spinal cord. Mild generalized cord atrophy. Ordinary mild thoracic degenerative disease. No compressive stenosis. No evidence of dural pathology, though there is considerable motion degradation in this region is well. Question abnormal vessels at the thoracolumbar junction region. Arteriovenous malformation not excluded on this motion degraded exam.  IMPRESSION: MRI head:  Advanced superficial siderosis with cerebellar atrophy.  MRI cervical spine: Superficial siderosis. No dural lesion  evident in the cervical region. Considerable motion degradation however.  MRI thoracic spine: Superficial siderosis. Considerable motion degradation here is well. No dural lesion identified. Some question of abnormal vessels at the thoracolumbar junction. Arteriovenous malformation not excluded.  In order to diagnose the most usual causes of CNS siderosis, we are going to need imaging without motion. This may require  sedation. I would at least re- exam in the lower thoracic spine and the lumbar spine looking for evidence of dural deficiencies or leaks or, in this case, possible arteriovenous malformation.   Electronically Signed   By: Nelson Chimes M.D.   On: 10/23/2014 17:22   Mr Cervical Spine W Wo Contrast  10/23/2014   CLINICAL DATA:  Fevers and chills, 7 days duration. Loss of urinary control.  EXAM: MRI HEAD WITHOUT AND WITH CONTRAST  MRI CERVICAL SPINE WITHOUT AND WITH CONTRAST  MRI thoracic spine without and with contrast  TECHNIQUE: Multiplanar, multiecho pulse sequences of the brain and surrounding structures, and cervical spine, to include the craniocervical junction and cervicothoracic junction, were obtained without and with intravenous contrast.  CONTRAST:  33mL MULTIHANCE GADOBENATE DIMEGLUMINE 529 MG/ML IV SOLN  COMPARISON:  None.  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 Hemosiderin codes the surface of the upper spinal cord, the cerebellum an the basilar structures of the brain. There is pronounced cerebellar atrophy. No ischemic change affecting the cerebral hemispheres. No evidence of mass lesion, hydrocephalus or extra-axial collection. Venous sinuses are patent. Arterial structures are patent. After contrast administration, no abnormal enhancement occurs. Small insignificant venous angioma in the right parietal white matter without evidence of previous hemorrhage.  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 Mild generalized cord atrophy. Ordinary spondylosis at C3-4, C5-6 and C6-7 with narrowing of the ventral subarachnoid space but no cord compression or significant foraminal compromise. No evidence of primary vascular lesion in the cervical region. No evidence of dural pathology. Scans are rather significantly motion degraded but I do not think we would miss a large dural leak or lesion.   MRI thoracic spine findings  Superficial siderosis affecting the entire thoracic spinal cord. Mild generalized cord atrophy. Ordinary mild thoracic degenerative disease. No compressive stenosis. No evidence of dural pathology, though there is considerable motion degradation in this region is well. Question abnormal vessels at the thoracolumbar junction region. Arteriovenous malformation not excluded on this motion degraded exam.  IMPRESSION: MRI head:  Advanced superficial siderosis with cerebellar atrophy.  MRI cervical spine: Superficial siderosis. No dural lesion evident in the cervical region. Considerable motion degradation however.  MRI thoracic spine: Superficial siderosis. Considerable motion degradation here is well. No dural lesion identified. Some question of abnormal vessels at the thoracolumbar junction. Arteriovenous malformation not excluded.  In order to diagnose the most usual causes of CNS siderosis, we are going to need imaging without motion. This may require sedation. I would at least re- exam in the lower thoracic spine and the lumbar spine looking for evidence of dural deficiencies or leaks or, in this case, possible arteriovenous malformation.   Electronically Signed   By: Nelson Chimes M.D.   On: 10/23/2014 17:22   Mr Thoracic Spine W Wo Contrast  10/25/2014   CLINICAL DATA:  Superficial siderosis of the central nervous system. Equivocal abnormality at the lower thoracic region on the previous motion degraded exam.  EXAM: MRI THORACIC AND LUMBAR SPINE WITHOUT AND WITH CONTRAST  TECHNIQUE:  Multiplanar and multiecho pulse sequences of the thoracic and lumbar spine were obtained without and with intravenous contrast.  CONTRAST:  57mL MULTIHANCE GADOBENATE DIMEGLUMINE 529 MG/ML IV SOLN  COMPARISON:  10/23/2014  FINDINGS: MR THORACIC SPINE FINDINGS  Study again shows hemosiderin deposition along the surface of the entire thoracic cord. There is probably some generalized cord atrophy. There is no  significant degenerative disease in the thoracic spine. There is no spinal stenosis. No mass or vascular lesion is identified at T12 or above. The extensive abnormality in the lumbar region will be described in at examination. Incidental note is made of a benign bone cyst in the right lateral mass of T5, not significant.  MR LUMBAR SPINE FINDINGS  The conus tip ends at the L1 level. Beginning at L1 and extending all the way through lumbar and sacral spinal canal, there is a space-occupying abnormality at displaces the nerve roots laterally for the most part. Towards the inferior aspect, some of the nerve roots extent more towards the middle of the abnormality. The abnormality shows CSF intensity before contrast administration. After contrast administration, there is smudgy contrast enhancement along the margins and internally. 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 I did consider whether this could represent a combination of arachnoid cysts and chronic arachnoiditis related to recurrent hemorrhage, but think that is less likely. Myxopapillary ependymomas are known to be associated with chronic recurrent hemorrhage and siderosis.  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 These lesions are known to be associated with recurrent hemorrhage and siderosis. I do think that it is possible that this could be a combination of arachnoid cysts and arachnoiditis related to previous hemorrhage, but think that explanation is less likely.   Electronically Signed   By: Nelson Chimes M.D.   On: 10/25/2014 11:21   Mr Thoracic Spine W Wo Contrast  10/23/2014   CLINICAL DATA:  Fevers and chills, 7 days duration. Loss of urinary control.  EXAM: MRI HEAD WITHOUT AND WITH CONTRAST  MRI CERVICAL SPINE WITHOUT AND WITH CONTRAST  MRI thoracic spine without and with  contrast  TECHNIQUE: Multiplanar, multiecho pulse sequences of the brain and surrounding structures, and cervical spine, to include the craniocervical junction and cervicothoracic junction, were obtained without and with intravenous contrast.  CONTRAST:  21mL MULTIHANCE GADOBENATE DIMEGLUMINE 529 MG/ML IV SOLN  COMPARISON:  None.  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 Hemosiderin codes the surface of the upper spinal cord, the cerebellum an the basilar structures of the brain. There is pronounced cerebellar atrophy. No ischemic change affecting the cerebral hemispheres. No evidence of mass lesion, hydrocephalus or extra-axial collection. Venous sinuses are patent. Arterial structures are patent. After contrast administration, no abnormal enhancement occurs. Small insignificant venous angioma in the right parietal white matter without evidence of previous hemorrhage.  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 Mild generalized cord atrophy. Ordinary spondylosis at C3-4, C5-6 and C6-7 with narrowing of the ventral subarachnoid space but no cord compression or significant foraminal compromise. No evidence of primary vascular lesion in the cervical region. No evidence of dural pathology. Scans are rather significantly motion degraded but I do not think we would miss a large dural leak or lesion.  MRI thoracic spine findings  Superficial siderosis affecting the entire thoracic spinal cord. Mild generalized  cord atrophy. Ordinary mild thoracic degenerative disease. No compressive stenosis. No evidence of dural pathology, though there is considerable motion degradation in this region is well. Question abnormal vessels at the thoracolumbar junction region. Arteriovenous malformation not excluded on this motion degraded exam.  IMPRESSION: MRI head:  Advanced superficial siderosis with cerebellar atrophy.  MRI  cervical spine: Superficial siderosis. No dural lesion evident in the cervical region. Considerable motion degradation however.  MRI thoracic spine: Superficial siderosis. Considerable motion degradation here is well. No dural lesion identified. Some question of abnormal vessels at the thoracolumbar junction. Arteriovenous malformation not excluded.  In order to diagnose the most usual causes of CNS siderosis, we are going to need imaging without motion. This may require sedation. I would at least re- exam in the lower thoracic spine and the lumbar spine looking for evidence of dural deficiencies or leaks or, in this case, possible arteriovenous malformation.   Electronically Signed   By: Nelson Chimes M.D.   On: 10/23/2014 17:22   Mr Lumbar Spine W Wo Contrast  10/25/2014   CLINICAL DATA:  Superficial siderosis of the central nervous system. Equivocal abnormality at the lower thoracic region on the previous motion degraded exam.  EXAM: MRI THORACIC AND LUMBAR SPINE WITHOUT AND WITH CONTRAST  TECHNIQUE: Multiplanar and multiecho pulse sequences of the thoracic and lumbar spine were obtained without and with intravenous contrast.  CONTRAST:  42mL MULTIHANCE GADOBENATE DIMEGLUMINE 529 MG/ML IV SOLN  COMPARISON:  10/23/2014  FINDINGS: MR THORACIC SPINE FINDINGS  Study again shows hemosiderin deposition along the surface of the entire thoracic cord. There is probably some generalized cord atrophy. There is no significant degenerative disease in the thoracic spine. There is no spinal stenosis. No mass or vascular lesion is identified at T12 or above. The extensive abnormality in the lumbar region will be described in at examination. Incidental note is made of a benign bone cyst in the right lateral mass of T5, not significant.  MR LUMBAR SPINE FINDINGS  The conus tip ends at the L1 level. Beginning at L1 and extending all the way through lumbar and sacral spinal canal, there is a space-occupying abnormality at displaces  the nerve roots laterally for the most part. Towards the inferior aspect, some of the nerve roots extent more towards the middle of the abnormality. The abnormality shows CSF intensity before contrast administration. After contrast administration, there is smudgy contrast enhancement along the margins and internally. 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 I did consider whether this could represent a combination of arachnoid cysts and chronic arachnoiditis related to recurrent hemorrhage, but think that is less likely. Myxopapillary ependymomas are known to be associated with chronic recurrent hemorrhage and siderosis.  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 These lesions are known to be associated with recurrent hemorrhage and siderosis. I do think that it is possible that this could be a combination of arachnoid cysts and arachnoiditis related to previous hemorrhage, but think that explanation is less likely.   Electronically Signed   By: Nelson Chimes M.D.   On: 10/25/2014 11:21   Ct Abdomen Pelvis W Contrast  10/21/2014   CLINICAL DATA:  7 days of fever, fatigue, back pain. Was seen at [facility] last week for same symptoms, dx'd with UTI. Patient reports that he was seen in February for urinary retention and found to have an enlarged prostate. He  was not treated for prostatitis at that time and was scheduled a follow-up appointment with urologist in April. Given doxazosin reports he has not been taking it. He reports frequent urinations, little production, suprapubic fullness, abdominal pain. He denies blood, discharge, painful urinations. He reports nausea, no vomiting. Mild frontal headache from time to time, no associated neuro symptoms. Patient reports he's been treating his fever with Tylenol, MAXIMUM TEMPERATURE 105.1. States he must  take Tylenol around the clock or his temperature will spike. Patient denies neck stiffness, chest pain, shortness of breath, vomiting, lower extremity swelling.  EXAM: CT ABDOMEN AND PELVIS WITH CONTRAST  TECHNIQUE: Multidetector CT imaging of the abdomen and pelvis was performed using the standard protocol following bolus administration of intravenous contrast.  CONTRAST:  66mL OMNIPAQUE IOHEXOL 300 MG/ML  SOLN  COMPARISON:  09/09/2009  FINDINGS: Minimal dependent atelectasis in the visualized lung bases. Unremarkable liver, nondilated gallbladder, spleen, adrenal glands, pancreas, abdominal aorta, portal vein.  New bilateral low attenuation renal lesions. Largest on the right in the lower pole measures approximately 9x28x 32 mm and is partially exophytic posteriorly, with inflammatory/ edematous changes in the adjacent retroperitoneal fat. Largest on the left medially measures 15x24x40 mm and extends adjacent to the psoas musculature. There are multiple additional smaller foci bilaterally, more extensive on the right than left. There are wedge-shaped areas of decreased enhancement in both kidneys in the regions of the focal lesions. There is right hydronephrosis and ureterectasis down to the ureteral orifice. Urinary bladder is distended with mild diffuse wall thickening with a crenulated contour.  Stomach, small bowel, and colon are nondilated. No ascites. No free air. No adenopathy. Small Schmorl's nodes at all levels T8-L4. Sacral Tarlov cysts .  IMPRESSION: 1. Multiple bilateral renal abscesses. 2. Right hydronephrosis and ureterectasis without calculus or evident obstructing mass.   Electronically Signed   By: Lucrezia Europe M.D.   On: 10/21/2014 20:51    Microbiology: Recent Results (from the past 240 hour(s))  Urine culture     Status: None   Collection Time: 10/21/14  7:41 PM  Result Value Ref Range Status   Specimen Description URINE, CLEAN CATCH  Final   Special Requests NONE  Final   Colony Count  NO GROWTH Performed at [facility]   Final   Culture NO GROWTH Performed at [facility]   Final   Report Status 10/23/2014 FINAL  Final  Blood culture (routine x 2)     Status: None (Preliminary result)   Collection Time: 10/21/14 10:15 PM  Result Value Ref Range Status   Specimen Description BLOOD LEFT ANTECUBITAL  Final   Special Requests BOTTLES DRAWN AEROBIC AND ANAEROBIC 5CC  Final   Culture   Final           BLOOD CULTURE RECEIVED NO GROWTH TO DATE CULTURE WILL BE HELD FOR 5 DAYS BEFORE ISSUING A FINAL NEGATIVE REPORT Performed at [facility]    Report Status PENDING  Incomplete  Blood culture (routine x 2)     Status: None (Preliminary result)   Collection Time: 10/21/14 10:22 PM  Result Value Ref Range Status   Specimen Description BLOOD LEFT WRIST  Final   Special Requests BOTTLES DRAWN AEROBIC AND ANAEROBIC 3CC  Final   Culture   Final           BLOOD CULTURE RECEIVED NO GROWTH TO DATE CULTURE WILL BE HELD FOR 5 DAYS BEFORE ISSUING A FINAL NEGATIVE REPORT Performed at [facility]  Report Status PENDING  Incomplete     Labs: Basic Metabolic Panel:  Recent Labs Lab 10/21/14 1850 10/22/14 0704 10/23/14 0542 10/24/14 0420  NA 139 139 139 139  K 4.1 4.1 4.1 3.8  CL 102 106 105 104  CO2 26 26 29 31   GLUCOSE 123* 105* 107* 102*  BUN 11 9 9 10   CREATININE 1.20 1.16 1.18 1.10  CALCIUM 8.8 8.3* 8.4 8.7   Liver Function Tests:  Recent Labs Lab 10/21/14 1850  AST 26  ALT 26  ALKPHOS 63  BILITOT 0.7  PROT 6.7  ALBUMIN 2.8*    Recent Labs Lab 10/21/14 1850  LIPASE 34   No results for input(s): AMMONIA in the last 168 hours. CBC:  Recent Labs Lab 10/21/14 1850 10/22/14 0704 10/23/14 0542 10/24/14 0420  WBC 9.9 12.1* 10.7* 10.5  NEUTROABS 7.5  --   --   --   HGB 11.7* 11.7* 11.5* 11.8*  HCT 36.6* 35.5* 35.7* 36.4*  MCV 101.1* 99.7 100.0 99.5  PLT 234 249 267 303   Cardiac Enzymes: No results for  input(s): CKTOTAL, CKMB, CKMBINDEX, TROPONINI in the last 168 hours. BNP: BNP (last 3 results) No results for input(s): BNP in the last 8760 hours.  ProBNP (last 3 results) No results for input(s): PROBNP in the last 8760 hours.  CBG: No results for input(s): GLUCAP in the last 168 hours.     Signed:  Eliot Popper L  [facility] 10/26/2014, 1:32 PM

## 2014-10-26 NOTE — Care Management Note (Signed)
CARE MANAGEMENT NOTE 10/26/2014  Patient:  Andrew Higgins, Andrew Higgins   Account Number:  [id number]  Date Initiated:  10/23/2014  Documentation initiated by:  Christine Morton  Subjective/Objective Assessment:   CM following for progression and d/c planning.     Action/Plan:   Met with pt and wife, re [facility] services for IV antibiotics. Pt wife selected [facility] for these services, [place] notified, await prescription.  10/26/14 PICC in place, notified [facility] of possible d/c and plan for IV Rocephin x 2wks. Pt updated.   Anticipated DC Date:  10/26/2014   Anticipated DC Plan:  [place]         [facility] Choice  HOME HEALTH   Choice offered to / List presented to:  C-1 Patient   DME arranged  IV PUMP/EQUIPMENT      DME agency  [place] arranged  HH-1 RN      [place].   Status of service:  Completed, signed off Medicare Important Message given?  NO (If response is "NO", the following Medicare IM given date fields will be blank) Date Medicare IM given:   Medicare IM given by:   Date Additional Medicare IM given:   Additional Medicare IM given by:    Discharge Disposition:  [place]  Per UR Regulation:    If discussed at Long Length of Stay Meetings, dates discussed:    Comments:

## 2014-10-26 NOTE — Progress Notes (Signed)
Foley catheter drain bag changed to leg bag.  Patient and wife observed and understand how to remove and adjust ties and manage drain bag.  Discharge instructions and medications discussed with patient and wife.  All questions answered.

## 2014-10-26 NOTE — Progress Notes (Signed)
Patient ID: Andrew Higgins, male   DOB: 10/13/1975, 39 y.o.   MRN: [id number] Spoke with patient and mother. Note to follow

## 2014-10-26 NOTE — Progress Notes (Signed)
Subjective:  1- Renal Abscesses / Pyelonephritis - pt with "UTI" diagnosed 3rd week March 2016 and started Cipro course by PCP, no CX's obtianed. F/u UCX at my office 3/29 negative for growth. Persistant / recurrent fever to 105 prompting repeat ER eval  and CT with multifocal small renal abscesses. New BCX, UCX obtained and negative. Now afebrile x 48 hrs on IV Rocephin.   2 - Urinary Retention / Lower Urinary Tract Symptoms  Neurogenic Bladder - long h/o obstructive symptoms with urgency / freqeuncy and nocturnal incontinence x mos. PVR 665mL noted in ER 09/2014. 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 Now with indwelling foley. WIll likely need neurosurgical resection after clears infectious parameters.   3 - Mild Right Hydronephrosis - mild rt hydro to somewhat distended bladder by ER CT 09/2014, no stones, masses. Recent urinary retention as per above.   PMH sig for neural hearing loss, bilateral inguinal hernia repair. NO CV disease. No blood thinners.   Today " [name] " is stable. Feels much better now afebrile.   Objective: Vital signs in last 24 hours: Temp:  [98.5 F (36.9 C)-99 F (37.2 C)] 98.5 F (36.9 C) (04/04 1645) Pulse Rate:  [76-94] 80 (04/04 1645) Resp:  [17-18] 17 (04/04 1645) BP: (106-127)/(59-79) 110/67 mmHg (04/04 1645) SpO2:  [96 %-100 %] 100 % (04/04 1645) Weight:  [80.3 kg (177 lb 0.5 oz)] 80.3 kg (177 lb 0.5 oz) (04/03 2038) Last BM Date: 10/24/14  Intake/Output from previous day: 04/03 0701 - 04/04 0700 In: 750 [P.O.:700; IV Piggyback:50] Out: 2600 [Urine:2600] Intake/Output this shift: Total I/O In: 240 [P.O.:240] Out: 1200 [Urine:1200]  General appearance: alert, cooperative and appears stated age Head: Normocephalic, without obvious abnormality, atraumatic Nose: Nares normal. Septum midline. Mucosa normal. No drainage or sinus tenderness. Throat: lips, mucosa, and tongue normal; teeth and gums normal Neck: supple, symmetrical,  trachea midline Back: symmetric, no curvature. ROM normal. No CVA tenderness. Resp: non-labored on room air Cardio: Nl rate GI: soft, non-tender; bowel sounds normal; no masses,  no organomegaly Male genitalia: normal, foley c/d/i with clear urine.  Extremities: extremities normal, atraumatic, no cyanosis or edema Pulses: 2+ and symmetric Skin: Skin color, texture, turgor normal. No rashes or lesions Lymph nodes: Cervical, supraclavicular, and axillary nodes normal. Neurologic: Mental status: Alert, oriented, thought content appropriate, baseline hearing loss.   Lab Results:   Recent Labs  10/24/14 0420  WBC 10.5  HGB 11.8*  HCT 36.4*  PLT 303   BMET  Recent Labs  10/24/14 0420  NA 139  K 3.8  CL 104  CO2 31  GLUCOSE 102*  BUN 10  CREATININE 1.10  CALCIUM 8.7   PT/INR No results for input(s): LABPROT, INR in the last 72 hours. ABG No results for input(s): PHART, HCO3 in the last 72 hours.  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 Equivocal abnormality at the lower thoracic region on the previous motion degraded exam.  EXAM: MRI THORACIC AND LUMBAR SPINE WITHOUT AND WITH CONTRAST  TECHNIQUE: Multiplanar and multiecho pulse sequences of the thoracic and lumbar spine were obtained without and with intravenous contrast.  CONTRAST:  58mL MULTIHANCE GADOBENATE DIMEGLUMINE 529 MG/ML IV SOLN  COMPARISON:  10/23/2014  FINDINGS: MR THORACIC SPINE FINDINGS  Study again shows hemosiderin deposition along the surface of the entire thoracic cord. There is probably some generalized cord atrophy. There is  no significant degenerative disease in the thoracic spine. There is no spinal stenosis. No mass or vascular lesion is identified at T12 or above. The extensive abnormality in the lumbar region will be described in at examination. Incidental note is made of a benign bone  cyst in the right lateral mass of T5, not significant.  MR LUMBAR SPINE FINDINGS  The conus tip ends at the L1 level. Beginning at L1 and extending all the way through lumbar and sacral spinal canal, there is a space-occupying abnormality at displaces the nerve roots laterally for the most part. Towards the inferior aspect, some of the nerve roots extent more towards the middle of the abnormality. The abnormality shows CSF intensity before contrast administration. After contrast administration, there is smudgy contrast enhancement along the margins and internally. 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 I did consider whether this could represent a combination of arachnoid cysts and chronic arachnoiditis related to recurrent hemorrhage, but think that is less likely. Myxopapillary ependymomas are known to be associated with chronic recurrent hemorrhage and siderosis.  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 These lesions are known to be associated with recurrent hemorrhage and siderosis. I do think that it is possible that this could be a combination of arachnoid cysts and arachnoiditis related to previous hemorrhage, but think that explanation is less likely.   Electronically Signed   By: Nelson Chimes M.D.   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 Equivocal abnormality at the lower thoracic region on the previous motion degraded exam.  EXAM: MRI THORACIC AND LUMBAR SPINE WITHOUT AND WITH CONTRAST  TECHNIQUE: Multiplanar and multiecho pulse sequences of the thoracic and lumbar spine were obtained without and with intravenous contrast.  CONTRAST:  70mL MULTIHANCE GADOBENATE DIMEGLUMINE 529 MG/ML IV SOLN  COMPARISON:  10/23/2014  FINDINGS: MR  THORACIC SPINE FINDINGS  Study again shows hemosiderin deposition along the surface of the entire thoracic cord. There is probably some generalized cord atrophy. There is no significant degenerative disease in the thoracic spine. There is no spinal stenosis. No mass or vascular lesion is identified at T12 or above. The extensive abnormality in the lumbar region will be described in at examination. Incidental note is made of a benign bone cyst in the right lateral mass of T5, not significant.  MR LUMBAR SPINE FINDINGS  The conus tip ends at the L1 level. Beginning at L1 and extending all the way through lumbar and sacral spinal canal, there is a space-occupying abnormality at displaces the nerve roots laterally for the most part. Towards the inferior aspect, some of the nerve roots extent more towards the middle of the abnormality. The abnormality shows CSF intensity before contrast administration. After contrast administration, there is smudgy contrast enhancement along the margins and internally. 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 I did consider whether this could represent a combination of arachnoid cysts and chronic arachnoiditis related to recurrent hemorrhage, but think that is less likely. Myxopapillary ependymomas are known to be associated with chronic recurrent hemorrhage and siderosis.  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 These lesions are known to be associated with recurrent hemorrhage and siderosis. I do think  that it is possible that this could be a combination of arachnoid cysts and arachnoiditis related to previous hemorrhage, but think that explanation is less likely.   Electronically Signed   By: Nelson Chimes M.D.   On: 10/25/2014 11:21    Anti-infectives: Anti-infectives    Start     Dose/Rate Route Frequency  Ordered Stop   10/26/14 1400  cefTRIAXone (ROCEPHIN) 1 g in dextrose 5 % 50 mL IVPB     1 g 100 mL/hr over 30 Minutes Intravenous Every 24 hours 10/26/14 1300     10/26/14 0000  cefTRIAXone 1 g in dextrose 5 % 50 mL     1 g 100 mL/hr over 30 Minutes Intravenous Every 24 hours 10/26/14 1226     10/23/14 0000  cefTRIAXone 1 g in dextrose 5 % 50 mL  Status:  Discontinued     1 g 100 mL/hr over 30 Minutes Intravenous Every 24 hours 10/23/14 1042 10/26/14    10/22/14 2130  metroNIDAZOLE (FLAGYL) IVPB 500 mg  Status:  Discontinued     500 mg 100 mL/hr over 60 Minutes Intravenous Every 8 hours 10/21/14 2239 10/23/14 0839   10/22/14 2115  cefTRIAXone (ROCEPHIN) 1 g in dextrose 5 % 50 mL IVPB  Status:  Discontinued     1 g 100 mL/hr over 30 Minutes Intravenous Every 24 hours 10/21/14 2238 10/26/14 1300   10/21/14 2300  vancomycin (VANCOCIN) 1,500 mg in sodium chloride 0.9 % 500 mL IVPB  Status:  Discontinued     1,500 mg 250 mL/hr over 120 Minutes Intravenous Every 12 hours 10/21/14 2253 10/23/14 0839   10/21/14 2130  vancomycin (VANCOCIN) IVPB 1000 mg/200 mL premix  Status:  Discontinued     1,000 mg 200 mL/hr over 60 Minutes Intravenous  Once 10/21/14 2124 10/21/14 2253   10/21/14 2130  metroNIDAZOLE (FLAGYL) IVPB 500 mg     500 mg 100 mL/hr over 60 Minutes Intravenous  Once 10/21/14 2129 10/22/14 0545   10/21/14 2115  cefTRIAXone (ROCEPHIN) 1 g in dextrose 5 % 50 mL IVPB     1 g 100 mL/hr over 30 Minutes Intravenous  Once 10/21/14 2100 10/21/14 2152      Assessment/Plan:  1- Renal Abscesses / Pyelonephritis - agree with home IV rocephin via PICC x 2 weeks. He will have repeat renal imaging at our office in 2-3 weeks.   2 - Urinary Retention / Lower Urinary Tract Symptoms  Neurogenic Bladder - reinforced need for bladder drainage and will likely need either foley, SP Tube, or self- cath for several mos at least. We discussed pros / cons of these modalities and he will consider with plan  to make decision next Urol visit. I reccomended self cath or SP tube.   3 - Mild Right Hydronephrosis - likely reflux, GFR normal, observe.    4 - Pt as Urol, Neurol, and Neurosurg f/u. Agree OK to DC home today.   [facility], Bruna Dills 10/26/2014

## 2014-10-27 NOTE — Consult Note (Signed)
NAME:  [name], [name] NO.:  [id number]  MEDICAL RECORD NO.:  [id number]  LOCATION:  6E05C                        FACILITY:  [facility]  PHYSICIAN:  Leeroy Cha, M.D.   DATE OF BIRTH:  10/30/1975  DATE OF CONSULTATION: DATE OF DISCHARGE:  10/26/2014                                CONSULTATION   Andrew Higgins is a patient who was seen today for neurosurgical consult. He more than was present.  The patient was admitted to the hospital with urinary retention, fever and chills.  He was found by the CT scan of the abdomen plus urinary test that he has a bad case of infection in both kidneys.  He has a Foley catheter.  He denies any weakness and when I saw him, he was walking around the room.  Clinically, I cannot find any weakness, although sometimes, he complains of weakness on the left leg. He has a Foley catheter in.  Sensation is normal.  He denies any back pain.  The thoracic MRI and the lumbar MRI were seen.  There is a large mass from lumbar 1 all the way down to the sacrum displacing the nerve root to the side.  There is a quite a bit of hemosiderin in the thoracic area.  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  The situation here that we need to do a biopsy just to be sure of the tumor and continuation with resection of the tumor from L1-S1.  For his risk care, we will attempt not to do anything until he is completely infection free.  The last thing we are going to do is to do a major surgery on him in view of the infection and that him having a bad case of spine infection.  He will continue after intervention, urologist, I will follow while he is in the hospital.  I will sit with his wife who is not present, so I can show the x-rays, so they can know that what is going on.          ______________________________ Leeroy Cha, M.D.     EB/MEDQ  D:  10/26/2014  T:  10/27/2014  Job:  [id number]

## 2014-10-28 LAB — CULTURE, BLOOD (ROUTINE X 2)
CULTURE: NO GROWTH
CULTURE: NO GROWTH

## 2016-11-03 DIAGNOSIS — J069 Acute upper respiratory infection, unspecified: Secondary | ICD-10-CM | POA: Diagnosis not present

## 2016-11-03 DIAGNOSIS — R05 Cough: Secondary | ICD-10-CM | POA: Diagnosis not present

## 2016-11-05 ENCOUNTER — Encounter (HOSPITAL_COMMUNITY): Payer: Self-pay | Admitting: Emergency Medicine

## 2016-11-05 ENCOUNTER — Emergency Department (HOSPITAL_COMMUNITY): Payer: 59

## 2016-11-05 ENCOUNTER — Inpatient Hospital Stay (HOSPITAL_COMMUNITY)
Admission: EM | Admit: 2016-11-05 | Discharge: 2016-11-07 | DRG: 690 | Disposition: A | Discharge status: Home / Self Care | Payer: 59 | Source: Ambulatory Visit | Attending: Internal Medicine | Admitting: Internal Medicine

## 2016-11-05 DIAGNOSIS — N12 Tubulo-interstitial nephritis, not specified as acute or chronic: Secondary | ICD-10-CM | POA: Diagnosis present

## 2016-11-05 DIAGNOSIS — N1339 Other hydronephrosis: Secondary | ICD-10-CM

## 2016-11-05 DIAGNOSIS — R509 Fever, unspecified: Secondary | ICD-10-CM | POA: Diagnosis not present

## 2016-11-05 DIAGNOSIS — N179 Acute kidney failure, unspecified: Secondary | ICD-10-CM | POA: Diagnosis present

## 2016-11-05 DIAGNOSIS — R339 Retention of urine, unspecified: Secondary | ICD-10-CM | POA: Diagnosis present

## 2016-11-05 DIAGNOSIS — Z884 Allergy status to anesthetic agent status: Secondary | ICD-10-CM

## 2016-11-05 DIAGNOSIS — D492 Neoplasm of unspecified behavior of bone, soft tissue, and skin: Secondary | ICD-10-CM | POA: Diagnosis present

## 2016-11-05 DIAGNOSIS — B9620 Unspecified Escherichia coli [E. coli] as the cause of diseases classified elsewhere: Secondary | ICD-10-CM | POA: Diagnosis present

## 2016-11-05 DIAGNOSIS — C719 Malignant neoplasm of brain, unspecified: Secondary | ICD-10-CM | POA: Diagnosis present

## 2016-11-05 DIAGNOSIS — H9190 Unspecified hearing loss, unspecified ear: Secondary | ICD-10-CM | POA: Diagnosis present

## 2016-11-05 DIAGNOSIS — N10 Acute tubulo-interstitial nephritis: Secondary | ICD-10-CM | POA: Diagnosis not present

## 2016-11-05 DIAGNOSIS — N132 Hydronephrosis with renal and ureteral calculous obstruction: Secondary | ICD-10-CM | POA: Diagnosis not present

## 2016-11-05 DIAGNOSIS — D649 Anemia, unspecified: Secondary | ICD-10-CM | POA: Diagnosis present

## 2016-11-05 DIAGNOSIS — D696 Thrombocytopenia, unspecified: Secondary | ICD-10-CM | POA: Diagnosis not present

## 2016-11-05 DIAGNOSIS — Z85841 Personal history of malignant neoplasm of brain: Secondary | ICD-10-CM

## 2016-11-05 DIAGNOSIS — Z923 Personal history of irradiation: Secondary | ICD-10-CM

## 2016-11-05 DIAGNOSIS — N1330 Unspecified hydronephrosis: Secondary | ICD-10-CM | POA: Diagnosis present

## 2016-11-05 HISTORY — DX: Malignant (primary) neoplasm, unspecified: C80.1

## 2016-11-05 LAB — DIFFERENTIAL
BASOS PCT: 0 %
Basophils Absolute: 0 10*3/uL (ref 0.0–0.1)
EOS ABS: 0 10*3/uL (ref 0.0–0.7)
EOS PCT: 0 %
Lymphocytes Relative: 10 %
Lymphs Abs: 0.4 10*3/uL — ABNORMAL LOW (ref 0.7–4.0)
Monocytes Absolute: 0.9 10*3/uL (ref 0.1–1.0)
Monocytes Relative: 21 %
NEUTROS PCT: 69 %
Neutro Abs: 2.8 10*3/uL (ref 1.7–7.7)

## 2016-11-05 LAB — CBC
HCT: 35.7 % — ABNORMAL LOW (ref 39.0–52.0)
Hemoglobin: 12.3 g/dL — ABNORMAL LOW (ref 13.0–17.0)
MCH: 33.9 pg (ref 26.0–34.0)
MCHC: 34.5 g/dL (ref 30.0–36.0)
MCV: 98.3 fL (ref 78.0–100.0)
Platelets: 89 10*3/uL — ABNORMAL LOW (ref 150–400)
RBC: 3.63 MIL/uL — ABNORMAL LOW (ref 4.22–5.81)
RDW: 12.4 % (ref 11.5–15.5)
WBC: 3.7 10*3/uL — ABNORMAL LOW (ref 4.0–10.5)

## 2016-11-05 LAB — URINALYSIS, ROUTINE W REFLEX MICROSCOPIC
Bacteria, UA: NONE SEEN
Bilirubin Urine: NEGATIVE
Glucose, UA: NEGATIVE mg/dL
Ketones, ur: NEGATIVE mg/dL
NITRITE: POSITIVE — AB
PH: 5 (ref 5.0–8.0)
Protein, ur: 30 mg/dL — AB
Specific Gravity, Urine: 1.034 — ABNORMAL HIGH (ref 1.005–1.030)

## 2016-11-05 LAB — COMPREHENSIVE METABOLIC PANEL
ALT: 36 U/L (ref 17–63)
AST: 41 U/L (ref 15–41)
Albumin: 3.2 g/dL — ABNORMAL LOW (ref 3.5–5.0)
Alkaline Phosphatase: 66 U/L (ref 38–126)
Anion gap: 8 (ref 5–15)
BILIRUBIN TOTAL: 0.1 mg/dL — AB (ref 0.3–1.2)
BUN: 19 mg/dL (ref 6–20)
CALCIUM: 8.7 mg/dL — AB (ref 8.9–10.3)
CO2: 26 mmol/L (ref 22–32)
Chloride: 97 mmol/L — ABNORMAL LOW (ref 101–111)
Creatinine, Ser: 1.84 mg/dL — ABNORMAL HIGH (ref 0.61–1.24)
GFR calc Af Amer: 52 mL/min — ABNORMAL LOW (ref >60)
GFR calc non Af Amer: 45 mL/min — ABNORMAL LOW (ref >60)
Glucose, Bld: 142 mg/dL — ABNORMAL HIGH (ref 65–99)
Potassium: 3.5 mmol/L (ref 3.5–5.1)
Sodium: 131 mmol/L — ABNORMAL LOW (ref 135–145)
TOTAL PROTEIN: 6.8 g/dL (ref 6.5–8.1)

## 2016-11-05 LAB — RETICULOCYTES: RBC.: 3.79 MIL/uL — ABNORMAL LOW (ref 4.22–5.81)

## 2016-11-05 LAB — LIPASE, BLOOD: Lipase: 18 U/L (ref 11–51)

## 2016-11-05 LAB — LACTATE DEHYDROGENASE: LDH: 188 U/L (ref 98–192)

## 2016-11-05 LAB — I-STAT CG4 LACTIC ACID, ED: Lactic Acid, Venous: 1.38 mmol/L (ref 0.5–1.9)

## 2016-11-05 MED ORDER — DEXTROSE 5 % IV SOLN
1.0000 g | INTRAVENOUS | Status: DC
  Administered 2016-11-06: 1 g via INTRAVENOUS
  Filled 2016-11-05 (×3): qty 10

## 2016-11-05 MED ORDER — ACETAMINOPHEN 325 MG PO TABS: 650.0000 mg | ORAL_TABLET | Freq: Four times a day (QID) | ORAL | Status: DC | PRN

## 2016-11-05 MED ORDER — DEXTROSE 5 % IV SOLN
2.0000 g | Freq: Once | INTRAVENOUS | Status: AC
  Administered 2016-11-05: 2 g via INTRAVENOUS
  Filled 2016-11-05: qty 2

## 2016-11-05 MED ORDER — ONDANSETRON HCL 4 MG PO TABS: 4.0000 mg | ORAL_TABLET | Freq: Four times a day (QID) | ORAL | Status: DC | PRN

## 2016-11-05 MED ORDER — SODIUM CHLORIDE 0.9 % IV SOLN
INTRAVENOUS | Status: AC
  Administered 2016-11-06: 01:00:00 via INTRAVENOUS

## 2016-11-05 MED ORDER — SODIUM CHLORIDE 0.9 % IV SOLN
Freq: Once | INTRAVENOUS | Status: AC
  Administered 2016-11-05: 20:00:00 via INTRAVENOUS

## 2016-11-05 MED ORDER — ACETAMINOPHEN 325 MG PO TABS
ORAL_TABLET | ORAL | Status: AC
  Filled 2016-11-05: qty 2

## 2016-11-05 MED ORDER — ONDANSETRON HCL 4 MG/2ML IJ SOLN
4.0000 mg | Freq: Once | INTRAMUSCULAR | Status: AC
  Administered 2016-11-05: 4 mg via INTRAVENOUS
  Filled 2016-11-05: qty 2

## 2016-11-05 MED ORDER — ACETAMINOPHEN 650 MG RE SUPP: 650.0000 mg | Freq: Four times a day (QID) | RECTAL | Status: DC | PRN

## 2016-11-05 MED ORDER — ONDANSETRON HCL 4 MG/2ML IJ SOLN: 4.0000 mg | Freq: Four times a day (QID) | INTRAMUSCULAR | Status: DC | PRN

## 2016-11-05 MED ORDER — IOPAMIDOL (ISOVUE-300) INJECTION 61%
INTRAVENOUS | Status: AC
  Administered 2016-11-05: 100 mL
  Filled 2016-11-05: qty 100

## 2016-11-05 MED ORDER — ACETAMINOPHEN 325 MG PO TABS
650.0000 mg | ORAL_TABLET | Freq: Once | ORAL | Status: AC | PRN
  Administered 2016-11-05: 650 mg via ORAL

## 2016-11-05 MED ORDER — SODIUM CHLORIDE 0.9 % IV BOLUS (SEPSIS)
500.0000 mL | Freq: Once | INTRAVENOUS | Status: AC
  Administered 2016-11-05: 500 mL via INTRAVENOUS

## 2016-11-05 NOTE — ED Notes (Signed)
Patient transported to CT 

## 2016-11-05 NOTE — ED Provider Notes (Signed)
[facility] DEPT Provider Note   CSN: [id number] Arrival date & time: 11/05/16  1544     History   Chief Complaint Chief Complaint  Patient presents with  . Emesis  . Fever    HPI Andrew Higgins is a 41 y.o. male. Chief complaint is fever, right flank pain, and vomiting  HPI 41 year old male presents with 5 days of fevers weakness chills and today right flank pain and vomiting.  Feeling weak at work on Tuesday. She has had persistent symptoms through the week with shakes and chills daily. Continues to drink and hydrate well. Nausea today with an episode of emesis  Significant past medical history for an episode of urinary retention resulting in pyelonephritis and bilateral renal abscesses 2 years ago. During the course of evaluation had MRI of his spine and had an extensive spinal tumor. His stents undergone excision of the spinal tumor. However never regained incontinence of his bladder. Otherwise neurologically intact. Self cath daily 3-4 times per day. His previous episode he was septic. He was hospitalized. He was discharged home in finished 2 weeks of twice a day Rocephin.     Past Medical History:  Diagnosis Date  . Hard of hearing   . 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 Hydronephrosis 10/21/2014  . Urinary retention 10/21/2014  . Sepsis ([place]) 10/21/2014  . Renal abscess     History reviewed. No pertinent surgical history.     Home Medications    Prior to Admission medications   Medication Sig Start Date End Date Taking? Authorizing Provider  acetaminophen (TYLENOL) 500 MG tablet Take 500 mg by mouth every 6 (six) hours as needed for moderate pain.   Yes Historical Provider, MD  Multiple Vitamin (MULTIVITAMIN WITH MINERALS) TABS tablet Take 1 tablet by mouth daily.   Yes Historical Provider, MD     Family History No family history on file.  Social History Social History  Substance Use Topics  . Smoking status: Never Smoker  . Smokeless tobacco: Never Used  . Alcohol use No     Allergies   Phenobarbital and Fish allergy   Review of Systems Review of Systems  Constitutional: Positive for chills, diaphoresis, fatigue and fever. Negative for appetite change.  HENT: Negative for mouth sores, sore throat and trouble swallowing.   Eyes: Negative for visual disturbance.  Respiratory: Negative for cough, chest tightness, shortness of breath and wheezing.   Cardiovascular: Negative for chest pain.  Gastrointestinal: Positive for abdominal distention, nausea and vomiting. Negative for abdominal pain and diarrhea.  Endocrine: Negative for polydipsia, polyphagia and polyuria.  Genitourinary: Positive for flank pain. Negative for dysuria, frequency and hematuria.       Foul-smelling urine  Musculoskeletal: Negative for gait problem.  Skin: Negative for color change, pallor and rash.  Neurological: Negative for dizziness, syncope, light-headedness and headaches.  Hematological: Does not bruise/bleed easily.  Psychiatric/Behavioral: Negative for behavioral problems and confusion.     Physical Exam Updated Vital Signs BP 116/78   Pulse 62   Temp (S) (!) 100.6 F (38.1 C) (Oral)   Resp 12   Ht 6\' 2"  (1.88 m)   Wt 170 lb (77.1 kg)   SpO2 100%   BMI 21.83 kg/m   Physical Exam  Constitutional: He is oriented to person, place, and time. He appears well-developed and well-nourished. No  distress.  HENT:  Head: Normocephalic.  Eyes: Conjunctivae are normal. Pupils are equal, round, and reactive to light. No scleral icterus.  Neck: Normal range of motion. Neck supple. No thyromegaly present.  Cardiovascular: Normal rate and regular rhythm.  Exam reveals no gallop and no friction rub.   No murmur heard. Pulmonary/Chest: Effort normal and breath sounds normal. No respiratory  distress. He has no wheezes. He has no rales.  Abdominal: Soft. Bowel sounds are normal. He exhibits no distension. There is no tenderness. There is no rebound.  Tenderness to the right flank to percuss. Anterior abdomen soft benign  Musculoskeletal: Normal range of motion.  Neurological: He is alert and oriented to person, place, and time.  Skin: Skin is warm and dry. No rash noted.  Psychiatric: He has a normal mood and affect. His behavior is normal.     ED Treatments / Results  Labs (all labs ordered are listed, but only abnormal results are displayed) Labs Reviewed  COMPREHENSIVE METABOLIC PANEL - Abnormal; Notable for the following:       Result Value   Sodium 131 (*)    Chloride 97 (*)    Glucose, Bld 142 (*)    Creatinine, Ser 1.84 (*)    Calcium 8.7 (*)    Albumin 3.2 (*)    Total Bilirubin 0.1 (*)    GFR calc non Af Amer 45 (*)    GFR calc Af Amer 52 (*)    All other components within normal limits  CBC - Abnormal; Notable for the following:    WBC 3.7 (*)    RBC 3.63 (*)    Hemoglobin 12.3 (*)    HCT 35.7 (*)    Platelets 89 (*)    All other components within normal limits  URINALYSIS, ROUTINE W REFLEX MICROSCOPIC - Abnormal; Notable for the following:    APPearance CLOUDY (*)    Specific Gravity, Urine 1.034 (*)    Hgb urine dipstick MODERATE (*)    Protein, ur 30 (*)    Nitrite POSITIVE (*)    Leukocytes, UA LARGE (*)    Squamous Epithelial / LPF 0-5 (*)    All other components within normal limits  DIFFERENTIAL - Abnormal; Notable for the following:    Lymphs Abs 0.4 (*)    All other components within normal limits  CULTURE, BLOOD (ROUTINE X 2)  CULTURE, BLOOD (ROUTINE X 2)  URINE CULTURE  LIPASE, BLOOD  I-STAT CG4 LACTIC ACID, ED    EKG  EKG Interpretation None       Radiology Dg Chest 2 View  Result Date: 11/05/2016 CLINICAL DATA:  Fever.  Right flank pain. EXAM: CHEST  2 VIEW COMPARISON:  10/15/2014 chest radiograph. FINDINGS: Stable  cardiomediastinal silhouette with normal heart size. No pneumothorax. No pleural effusion. Lungs appear clear, with no acute consolidative airspace disease and no pulmonary edema. IMPRESSION: No active cardiopulmonary disease. Electronically Signed   By: Ilona Sorrel M.D.   On: 11/05/2016 17:54   Ct Abdomen Pelvis W Contrast  Result Date: 11/05/2016 CLINICAL DATA:  Fever. Right flank pain. History of renal abscess. Self catheterization. EXAM: CT ABDOMEN AND PELVIS WITH CONTRAST TECHNIQUE: Multidetector CT imaging of the abdomen and pelvis was performed using the standard protocol following bolus administration of intravenous contrast. CONTRAST:  162mL ISOVUE-300 IOPAMIDOL (ISOVUE-300) INJECTION 61% COMPARISON:  12/23/2014 CT abdomen.  10/21/2014 CT abdomen/pelvis. FINDINGS: Lower chest: No significant pulmonary nodules or acute consolidative airspace disease. Hepatobiliary: Normal liver with no liver mass.  Normal gallbladder with no radiopaque cholelithiasis. No biliary ductal dilatation. Pancreas: Normal, with no mass or duct dilation. Spleen: Normal size. No mass. Adrenals/Urinary Tract: Normal adrenals. Mild right hydroureteronephrosis to the level of the right ureterovesical junction. Mild diffuse left ureterectasis without overt left hydronephrosis. Diffuse urothelial wall thickening and hyperenhancement throughout the ureters and central renal collecting systems bilaterally. Abnormal patchy enhancement throughout the right kidney. Several of the dilated right renal calices extend to the periphery of right renal cortex. Mild patchy enhancement in the medial upper left kidney. Multifocal mild to moderate renal cortical scarring throughout the right kidney. Perinephric fat stranding, asymmetrically prominent on the right. No perinephric fluid collections. No contour deforming renal masses. Diffuse bladder wall thickening and hyperenhancement. Suggestion of diffuse mild bladder trabeculation. No evidence of  urolithiasis. Stomach/Bowel: Grossly normal stomach. Normal caliber small bowel with no small bowel wall thickening. Appendix not discretely visualized. No pericecal inflammatory changes. Normal large bowel with no diverticulosis, large bowel wall thickening or pericolonic fat stranding. Vascular/Lymphatic: Normal caliber abdominal aorta. Patent portal, splenic and renal veins . No pathologically enlarged lymph nodes in the abdomen or pelvis. Reproductive: Minimally enlarged prostate. Prostate dimensions 3.1 x 3.5 x 5.0 cm (volume = 28 cm^3). Other: No pneumoperitoneum, ascites or focal fluid collection. Musculoskeletal: No aggressive appearing focal osseous lesions. IMPRESSION: 1. Diffusely thick walled, trabeculated hyperenhancing bladder wall, compatible with acute cystitis with probable underlying chronic bladder voiding dysfunction. 2. Mild right hydroureteronephrosis. Diffuse left ureterectasis without left hydronephrosis. Diffuse urothelial wall thickening and hyperenhancement in the renal collecting systems and ureters compatible with bilateral ascending urinary tract infection. 3. Bilateral acute pyelonephritis, significantly more severe in the right kidney. No discrete renal abscess or perinephric fluid collections. Pre-existing multifocal renal cortical scarring in the right kidney. 4. Minimally enlarged prostate. Electronically Signed   By: Ilona Sorrel M.D.   On: 11/05/2016 18:29    Procedures Procedures (including critical care time)  Medications Ordered in ED Medications  acetaminophen (TYLENOL) 325 MG tablet (not administered)  0.9 %  sodium chloride infusion (not administered)  cefTRIAXone (ROCEPHIN) 2 g in dextrose 5 % 50 mL IVPB (not administered)  acetaminophen (TYLENOL) tablet 650 mg (650 mg Oral Given 11/05/16 1559)  sodium chloride 0.9 % bolus 500 mL (0 mLs Intravenous Stopped 11/05/16 1805)  ondansetron (ZOFRAN) injection 4 mg (4 mg Intravenous Given 11/05/16 1703)  iopamidol  (ISOVUE-300) 61 % injection (100 mLs  Contrast Given 11/05/16 1727)     Initial Impression / Assessment and Plan / ED Course  I have reviewed the triage vital signs and the nursing notes.  Pertinent labs & imaging results that were available during my care of the patient were reviewed by me and considered in my medical decision making (see chart for details).     Febrile at 102.6. Tachycardia improves after Tylenol. No blood pressure readings. Normal lactate. Creatinine high at 1.8. Urine appears infected. CT scan shows bilateral pyelonephritis with perinephric inflammation but no fluid collections. Right hydronephrosis but no obvious obstruction. Diffuse bladder thickening consistent with infection and chronic self-catheterization.  Not frankly septic. We symptomatic with his pyelonephritis. Mild acute kidney injury. Blood and urine cultures are obtained. Given IV Rocephin. We'll speak with unassigned medicine regarding admission.  Final Clinical Impressions(s) / ED Diagnoses   Final diagnoses:  Pyelonephritis    New Prescriptions New Prescriptions   No medications on file     Tanna Furry, MD 11/05/16 1933

## 2016-11-05 NOTE — H&P (Signed)
History and Physical    Andrew Higgins UTM:[id number] DOB: 1976-06-26 DOA: 11/05/2016  PCP: [name]  Patient coming from: Home.  Chief Complaint: Fever and chills.  HPI: Andrew Higgins is a 41 y.o. 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 Patient has been having this symptom for last 4 days. Denies any abdominal pain or diarrhea. Has had one episode of vomiting 2 days ago.  ED Course: In the ER patient was found to be febrile with CAT scan showing bilateral acute pyelonephritis with dry sterile hydronephrosis. On-call urologist Dr. Louis Meckel was consulted. At this time urologist advised no surgical intervention required and treat with antibiotics. Patienton IV ceftriaxone and blood cultures were obtained. Admitted for further observation.  Review of Systems: As per HPI, rest all negative.   Past Medical History:  Diagnosis Date  . Hard of hearing   . TBI (traumatic brain injury) [facility])    age 49- steel chair, seizures-- dilantin    History reviewed. No pertinent surgical history.   reports that he has never smoked. He has never used smokeless tobacco. He reports that he does not drink alcohol or use drugs.  Allergies  Allergen Reactions  . Phenobarbital Hives  . Fish Allergy Swelling and Other (See Comments)    Tongue swelling, lethargy    Family History  Problem Relation Age of Onset  . Diabetes Mellitus II Neg Hx   . CAD Neg Hx     Prior to Admission medications   Medication Sig Start Date End Date Taking? Authorizing Provider  acetaminophen (TYLENOL) 500 MG tablet Take 500 mg by mouth every 6 (six) hours as needed for moderate pain.   Yes Historical Provider, MD  Multiple Vitamin (MULTIVITAMIN WITH MINERALS) TABS tablet Take 1 tablet  by mouth daily.   Yes Historical Provider, MD    Physical Exam: Vitals:   11/05/16 2045 11/05/16 2100 11/05/16 2115 11/05/16 2200  BP: 126/83 120/80 129/80 (!) 146/88  Pulse: 68 70 68 83  Resp:   14 14  Temp:      TempSrc:      SpO2: 100% 100% 100% 100%  Weight:      Height:          Constitutional: Moderately built and nourished. Vitals:   11/05/16 2045 11/05/16 2100 11/05/16 2115 11/05/16 2200  BP: 126/83 120/80 129/80 (!) 146/88  Pulse: 68 70 68 83  Resp:   14 14  Temp:      TempSrc:      SpO2: 100% 100% 100% 100%  Weight:      Height:       Eyes: Anicteric no pallor. ENMT: No discharge from the ears eyes nose or mouth. Neck: No mass felt. No JVD appreciated. Respiratory: No rhonchi or crepitations. Cardiovascular: S1-S2 heard no murmurs appreciated. Abdomen: Soft nontender bowel sounds present. No guarding or rigidity. Musculoskeletal: No edema. No joint effusion. Skin: No rash or skin appears warm. Neurologic: Alert awake oriented to time place and person. Moves all extremities. Psychiatric: Appears normal. Normal affect.   Labs on Admission: I have personally reviewed following labs and imaging studies  CBC:  Recent Labs Lab 11/05/16 1600 11/05/16 1606  WBC 3.7*  --   NEUTROABS  --  2.8  HGB 12.3*  --   HCT 35.7*  --  MCV 98.3  --   PLT 89*  --    Basic Metabolic Panel:  Recent Labs Lab 11/05/16 1600  NA 131*  K 3.5  CL 97*  CO2 26  GLUCOSE 142*  BUN 19  CREATININE 1.84*  CALCIUM 8.7*   GFR: Estimated Creatinine Clearance: 59.4 mL/min (A) (by C-G formula based on SCr of 1.84 mg/dL (H)). Liver Function Tests:  Recent Labs Lab 11/05/16 1600  AST 41  ALT 36  ALKPHOS 66  BILITOT 0.1*  PROT 6.8  ALBUMIN 3.2*    Recent Labs Lab 11/05/16 1600  LIPASE 18   No results for input(s): AMMONIA in the last 168 hours. Coagulation Profile: No results for input(s): INR, PROTIME in the last 168 hours. Cardiac Enzymes: No results for  input(s): CKTOTAL, CKMB, CKMBINDEX, TROPONINI in the last 168 hours. BNP (last 3 results) No results for input(s): PROBNP in the last 8760 hours. HbA1C: No results for input(s): HGBA1C in the last 72 hours. CBG: No results for input(s): GLUCAP in the last 168 hours. Lipid Profile: No results for input(s): CHOL, HDL, LDLCALC, TRIG, CHOLHDL, LDLDIRECT in the last 72 hours. Thyroid Function Tests: No results for input(s): TSH, T4TOTAL, FREET4, T3FREE, THYROIDAB in the last 72 hours. Anemia Panel: No results for input(s): VITAMINB12, FOLATE, FERRITIN, TIBC, IRON, RETICCTPCT in the last 72 hours. Urine analysis:    Component Value Date/Time   COLORURINE YELLOW 11/05/2016 1841   APPEARANCEUR CLOUDY (A) 11/05/2016 1841   LABSPEC 1.034 (H) 11/05/2016 1841   PHURINE 5.0 11/05/2016 1841   GLUCOSEU NEGATIVE 11/05/2016 1841   HGBUR MODERATE (A) 11/05/2016 1841   BILIRUBINUR NEGATIVE 11/05/2016 1841   KETONESUR NEGATIVE 11/05/2016 1841   PROTEINUR 30 (A) 11/05/2016 1841   UROBILINOGEN 1.0 10/21/2014 1941   NITRITE POSITIVE (A) 11/05/2016 1841   LEUKOCYTESUR LARGE (A) 11/05/2016 1841   Sepsis Labs: @LABRCNTIP (procalcitonin:4,lacticidven:4) )No results found for this or any previous visit (from the past 240 hour(s)).   Radiological Exams on Admission: Dg Chest 2 View  Result Date: 11/05/2016 CLINICAL DATA:  Fever.  Right flank pain. EXAM: CHEST  2 VIEW COMPARISON:  10/15/2014 chest radiograph. FINDINGS: Stable cardiomediastinal silhouette with normal heart size. No pneumothorax. No pleural effusion. Lungs appear clear, with no acute consolidative airspace disease and no pulmonary edema. IMPRESSION: No active cardiopulmonary disease. Electronically Signed   By: Ilona Sorrel M.D.   On: 11/05/2016 17:54   Ct Abdomen Pelvis W Contrast  Result Date: 11/05/2016 CLINICAL DATA:  Fever. Right flank pain. History of renal abscess. Self catheterization. EXAM: CT ABDOMEN AND PELVIS WITH CONTRAST  TECHNIQUE: Multidetector CT imaging of the abdomen and pelvis was performed using the standard protocol following bolus administration of intravenous contrast. CONTRAST:  175mL ISOVUE-300 IOPAMIDOL (ISOVUE-300) INJECTION 61% COMPARISON:  12/23/2014 CT abdomen.  10/21/2014 CT abdomen/pelvis. FINDINGS: Lower chest: No significant pulmonary nodules or acute consolidative airspace disease. Hepatobiliary: Normal liver with no liver mass. Normal gallbladder with no radiopaque cholelithiasis. No biliary ductal dilatation. Pancreas: Normal, with no mass or duct dilation. Spleen: Normal size. No mass. Adrenals/Urinary Tract: Normal adrenals. Mild right hydroureteronephrosis to the level of the right ureterovesical junction. Mild diffuse left ureterectasis without overt left hydronephrosis. Diffuse urothelial wall thickening and hyperenhancement throughout the ureters and central renal collecting systems bilaterally. Abnormal patchy enhancement throughout the right kidney. Several of the dilated right renal calices extend to the periphery of right renal cortex. Mild patchy enhancement in the medial upper left kidney. Multifocal mild to moderate renal cortical  scarring throughout the right kidney. Perinephric fat stranding, asymmetrically prominent on the right. No perinephric fluid collections. No contour deforming renal masses. Diffuse bladder wall thickening and hyperenhancement. Suggestion of diffuse mild bladder trabeculation. No evidence of urolithiasis. Stomach/Bowel: Grossly normal stomach. Normal caliber small bowel with no small bowel wall thickening. Appendix not discretely visualized. No pericecal inflammatory changes. Normal large bowel with no diverticulosis, large bowel wall thickening or pericolonic fat stranding. Vascular/Lymphatic: Normal caliber abdominal aorta. Patent portal, splenic and renal veins . No pathologically enlarged lymph nodes in the abdomen or pelvis. Reproductive: Minimally enlarged prostate.  Prostate dimensions 3.1 x 3.5 x 5.0 cm (volume = 28 cm^3). Other: No pneumoperitoneum, ascites or focal fluid collection. Musculoskeletal: No aggressive appearing focal osseous lesions. IMPRESSION: 1. Diffusely thick walled, trabeculated hyperenhancing bladder wall, compatible with acute cystitis with probable underlying chronic bladder voiding dysfunction. 2. Mild right hydroureteronephrosis. Diffuse left ureterectasis without left hydronephrosis. Diffuse urothelial wall thickening and hyperenhancement in the renal collecting systems and ureters compatible with bilateral ascending urinary tract infection. 3. Bilateral acute pyelonephritis, significantly more severe in the right kidney. No discrete renal abscess or perinephric fluid collections. Pre-existing multifocal renal cortical scarring in the right kidney. 4. Minimally enlarged prostate. Electronically Signed   By: Ilona Sorrel M.D.   On: 11/05/2016 18:29     Assessment/Plan Principal Problem:   Pyelonephritis Active Problems:   Hydronephrosis   ARF (acute renal failure) (HCC)   Thrombocytopenia ([place])    1. Acute bilateral pyelonephritis, cystitis with right-sided hydronephrosis - on-call urologist Dr. Louis Meckel was consulted and urologist recommended IV antibiotics. Follow cultures. Continue hydration. 2. Acute renal failure - last creatinine seen in our system was in 2016. Was normal. Creatinine is increased from baseline. Suspect acute complement on chronic. Hydrate and recheck metabolic panel in a.m. 3. Chronic anemia and thrombocytopenia - reviewing patient's old labs patient does have chronic anemia and thrombocytopenia. Will check anemia panel and also rule out any hemolytic process by checking LDH. 4. Ependymoma - status post surgery and radiation therapy in 2016 and being followed at [facility].   DVT prophylaxis: SCDs due to thrombocytopenia. Code Status: Full code.  Family Communication: Patient's mother.    Disposition Plan: Home.  Consults called: ER physician discussed with urologist.  Admission status: Observation.    Rise Patience MD [facility] Pager [phone].  If 7PM-7AM, please contact night-coverage [email or web address] Password [facility]  11/05/2016, 10:22 PM

## 2016-11-05 NOTE — ED Notes (Signed)
Per pt and pt's family, pt has been self catheterizing for approx 2 years. Pt reports he is comfortable performing his own in and out catheter for urine sample, EDP aware. Pt given in and out catheter kit, set up by this nurse with sterile gloves. This nurse helped pt to the sink to perform hand hygiene and wash cloths and soap and water provided for peri care.

## 2016-11-05 NOTE — Consult Note (Signed)
I have reveiwed the patient's chart/CT and spoken with Dr. Eulis Foster. Patient has pyelonpehritis with right sided hydro-ureteronephrosis.  The hydro is chronic and likely secondary to reflux of urine up from the bladder.  He does not need any urologic interventions at this time.  I am happy to consult in more detail in the future if necessary. Burman Nieves, MD [facility]

## 2016-11-05 NOTE — ED Triage Notes (Signed)
Pt c/o vomiting and fever. Pt last vomited Thursday but continues to have chills. Pt denies abdominal pain.

## 2016-11-05 NOTE — ED Notes (Signed)
Pt returned from CT °

## 2016-11-05 NOTE — Progress Notes (Signed)
Pharmacy note: Rocephin  41 yo male with UTI. Pharmacy consulted to dose rocephin. -WBC= 3.7, tmax= 102.8  Plan -rocephin 2gm IV q24hr -No dose adjustments anticipated Will sign off. Please contact pharmacy with any other needs.  Thank you Hildred Laser, Pharm D 11/05/2016 10:36 PM

## 2016-11-06 ENCOUNTER — Encounter (HOSPITAL_COMMUNITY): Payer: Self-pay | Admitting: *Deleted

## 2016-11-06 DIAGNOSIS — N12 Tubulo-interstitial nephritis, not specified as acute or chronic: Secondary | ICD-10-CM | POA: Diagnosis not present

## 2016-11-06 DIAGNOSIS — H9190 Unspecified hearing loss, unspecified ear: Secondary | ICD-10-CM | POA: Diagnosis present

## 2016-11-06 DIAGNOSIS — D492 Neoplasm of unspecified behavior of bone, soft tissue, and skin: Secondary | ICD-10-CM | POA: Diagnosis not present

## 2016-11-06 DIAGNOSIS — Z923 Personal history of irradiation: Secondary | ICD-10-CM | POA: Diagnosis not present

## 2016-11-06 DIAGNOSIS — N1330 Unspecified hydronephrosis: Secondary | ICD-10-CM | POA: Diagnosis present

## 2016-11-06 DIAGNOSIS — Z884 Allergy status to anesthetic agent status: Secondary | ICD-10-CM | POA: Diagnosis not present

## 2016-11-06 DIAGNOSIS — C719 Malignant neoplasm of brain, unspecified: Secondary | ICD-10-CM | POA: Diagnosis present

## 2016-11-06 DIAGNOSIS — D696 Thrombocytopenia, unspecified: Secondary | ICD-10-CM | POA: Diagnosis not present

## 2016-11-06 DIAGNOSIS — D649 Anemia, unspecified: Secondary | ICD-10-CM | POA: Diagnosis present

## 2016-11-06 DIAGNOSIS — N10 Acute tubulo-interstitial nephritis: Secondary | ICD-10-CM | POA: Diagnosis present

## 2016-11-06 DIAGNOSIS — N1339 Other hydronephrosis: Secondary | ICD-10-CM | POA: Diagnosis not present

## 2016-11-06 DIAGNOSIS — N179 Acute kidney failure, unspecified: Secondary | ICD-10-CM | POA: Diagnosis not present

## 2016-11-06 DIAGNOSIS — Z85841 Personal history of malignant neoplasm of brain: Secondary | ICD-10-CM | POA: Diagnosis not present

## 2016-11-06 DIAGNOSIS — R339 Retention of urine, unspecified: Secondary | ICD-10-CM | POA: Diagnosis present

## 2016-11-06 DIAGNOSIS — B9620 Unspecified Escherichia coli [E. coli] as the cause of diseases classified elsewhere: Secondary | ICD-10-CM | POA: Diagnosis present

## 2016-11-06 LAB — IRON AND TIBC
Iron: 14 ug/dL — ABNORMAL LOW (ref 45–182)
SATURATION RATIOS: 6 % — AB (ref 17.9–39.5)
TIBC: 216 ug/dL — ABNORMAL LOW (ref 250–450)
UIBC: 202 ug/dL

## 2016-11-06 LAB — CBC
HCT: 31.4 % — ABNORMAL LOW (ref 39.0–52.0)
Hemoglobin: 10.4 g/dL — ABNORMAL LOW (ref 13.0–17.0)
MCH: 32.6 pg (ref 26.0–34.0)
MCHC: 33.1 g/dL (ref 30.0–36.0)
MCV: 98.4 fL (ref 78.0–100.0)
Platelets: 93 10*3/uL — ABNORMAL LOW (ref 150–400)
RBC: 3.19 MIL/uL — ABNORMAL LOW (ref 4.22–5.81)
RDW: 12.7 % (ref 11.5–15.5)
WBC: 4.1 10*3/uL (ref 4.0–10.5)

## 2016-11-06 LAB — BASIC METABOLIC PANEL
Anion gap: 11 (ref 5–15)
BUN: 17 mg/dL (ref 6–20)
CALCIUM: 8.1 mg/dL — AB (ref 8.9–10.3)
CO2: 26 mmol/L (ref 22–32)
Chloride: 99 mmol/L — ABNORMAL LOW (ref 101–111)
Creatinine, Ser: 1.58 mg/dL — ABNORMAL HIGH (ref 0.61–1.24)
GFR calc Af Amer: >60 mL/min (ref >60)
GFR, EST NON AFRICAN AMERICAN: 54 mL/min — AB (ref >60)
GLUCOSE: 118 mg/dL — AB (ref 65–99)
POTASSIUM: 3.5 mmol/L (ref 3.5–5.1)
Sodium: 136 mmol/L (ref 135–145)

## 2016-11-06 LAB — VITAMIN B12: Vitamin B-12: 470 pg/mL (ref 180–914)

## 2016-11-06 LAB — PATHOLOGIST SMEAR REVIEW

## 2016-11-06 LAB — FERRITIN: FERRITIN: 809 ng/mL — AB (ref 24–336)

## 2016-11-06 LAB — HIV ANTIBODY (ROUTINE TESTING W REFLEX): HIV SCREEN 4TH GENERATION: NONREACTIVE

## 2016-11-06 LAB — FOLATE: Folate: 23 ng/mL (ref >5.9)

## 2016-11-06 NOTE — Progress Notes (Signed)
Order for foley cath for urinary retention. Foley placed per protocol. Patient tolerated well.

## 2016-11-06 NOTE — Progress Notes (Signed)
PROGRESS NOTE    Andrew Higgins  JME:[id number] DOB: 04/08/1976 DOA: 11/05/2016 PCP: [name]   Outpatient Specialists:     Brief Narrative:  Andrew Higgins is a 41 y.o. 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 Patient has been having this symptom for last 4 days. Denies any abdominal pain or diarrhea. Has had one episode of vomiting 2 days ago.   Assessment & Plan:   Principal Problem:   Pyelonephritis Active Problems:   Hydronephrosis   ARF (acute renal failure) (HCC)   Thrombocytopenia (HCC)   Acute bilateral pyelonephritis, cystitis with right-sided hydronephrosis   -on-call urologist Dr. Louis Meckel was consulted and urologist recommended IV antibiotics -await cultures -Continue hydration.  Acute renal failure -  -last creatinine  was in 2016 and was normal. Creatinine is increased from baseline -decreaseing with IVF -plan to d/c foley before discharge and continue I/O caths at home  Chronic anemia and thrombocytopenia - reviewing patient's old labs patient does have chronic anemia and thrombocytopenia. -MCV about 100 LDH normal -outpatient follow up  Ependymoma - status post surgery and radiation therapy in 2016 and being followed at [facility].   DVT prophylaxis:  SCD's  Code Status: Full Code   Family Communication: patient  Disposition Plan:  Home once culture back and patient is afebrile   Consultants:   Urology (phone)      Subjective: Has been feeling poorly for a few days but is feeling better today  Objective: Vitals:   11/05/16 2228 11/06/16 0042 11/06/16 0627 11/06/16 1018  BP: 130/77  113/65 109/63  Pulse: 87  74 87  Resp: 16  17 18   Temp:  (!) 100.7 F (38.2 C) 99.4 F  (37.4 C) 99.3 F (37.4 C)  TempSrc:  Oral Oral Oral  SpO2: 98%  97% 97%  Weight:  70.1 kg (154 lb 9.6 oz)    Height: 6\' 3"  (1.905 m)       Intake/Output Summary (Last 24 hours) at 11/06/16 1047 Last data filed at 11/06/16 2297  Gross per 24 hour  Intake             1810 ml  Output              850 ml  Net              960 ml   Filed Weights   11/05/16 1554 11/06/16 0042  Weight: 77.1 kg (170 lb) 70.1 kg (154 lb 9.6 oz)    Examination:  General exam: Appears calm and comfortable  Respiratory system: Clear to auscultation. Respiratory effort normal. Cardiovascular system: S1 & S2 heard, RRR. No JVD, murmurs, rubs, gallops or clicks. No pedal edema. Gastrointestinal system: Abdomen is nondistended, soft and nontender. No organomegaly or masses felt. Normal bowel sounds heard. Central nervous system: Alert and oriented. No focal neurological deficits. Extremities: Symmetric 5 x 5 power.     Data Reviewed: I have personally reviewed following labs and imaging studies  CBC:  Recent Labs Lab 11/05/16 1600 11/05/16 1606 11/06/16 0458  WBC 3.7*  --  4.1  NEUTROABS  --  2.8  --   HGB 12.3*  --  10.4*  HCT 35.7*  --  31.4*  MCV 98.3  --  98.4  PLT 89*  --  93*   Basic Metabolic Panel:  Recent Labs Lab 11/05/16 1600 11/06/16 0458  NA 131* 136  K 3.5 3.5  CL 97* 99*  CO2 26 26  GLUCOSE 142* 118*  BUN 19 17  CREATININE 1.84* 1.58*  CALCIUM 8.7* 8.1*   GFR: Estimated Creatinine Clearance: 62.9 mL/min (A) (by C-G formula based on SCr of 1.58 mg/dL (H)). Liver Function Tests:  Recent Labs Lab 11/05/16 1600  AST 41  ALT 36  ALKPHOS 66  BILITOT 0.1*  PROT 6.8  ALBUMIN 3.2*    Recent Labs Lab 11/05/16 1600  LIPASE 18   No results for input(s): AMMONIA in the last 168 hours. Coagulation Profile: No results for input(s): INR, PROTIME in the last 168 hours. Cardiac Enzymes: No results for input(s): CKTOTAL, CKMB, CKMBINDEX, TROPONINI in the last 168  hours. BNP (last 3 results) No results for input(s): PROBNP in the last 8760 hours. HbA1C: No results for input(s): HGBA1C in the last 72 hours. CBG: No results for input(s): GLUCAP in the last 168 hours. Lipid Profile: No results for input(s): CHOL, HDL, LDLCALC, TRIG, CHOLHDL, LDLDIRECT in the last 72 hours. Thyroid Function Tests: No results for input(s): TSH, T4TOTAL, FREET4, T3FREE, THYROIDAB in the last 72 hours. Anemia Panel:  Recent Labs  11/05/16 2239  VITAMINB12 470  FOLATE 23.0  FERRITIN 809*  TIBC 216*  IRON 14*  RETICCTPCT <0.4*   Urine analysis:    Component Value Date/Time   COLORURINE YELLOW 11/05/2016 1841   APPEARANCEUR CLOUDY (A) 11/05/2016 1841   LABSPEC 1.034 (H) 11/05/2016 1841   PHURINE 5.0 11/05/2016 1841   GLUCOSEU NEGATIVE 11/05/2016 1841   HGBUR MODERATE (A) 11/05/2016 1841   BILIRUBINUR NEGATIVE 11/05/2016 1841   KETONESUR NEGATIVE 11/05/2016 1841   PROTEINUR 30 (A) 11/05/2016 1841   UROBILINOGEN 1.0 10/21/2014 1941   NITRITE POSITIVE (A) 11/05/2016 1841   LEUKOCYTESUR LARGE (A) 11/05/2016 1841     )No results found for this or any previous visit (from the past 240 hour(s)).    Anti-infectives    Start     Dose/Rate Route Frequency Ordered Stop   11/05/16 2330  cefTRIAXone (ROCEPHIN) 1 g in dextrose 5 % 50 mL IVPB     1 g 100 mL/hr over 30 Minutes Intravenous Every 24 hours 11/05/16 2230     11/05/16 1915  cefTRIAXone (ROCEPHIN) 2 g in dextrose 5 % 50 mL IVPB     2 g 100 mL/hr over 30 Minutes Intravenous  Once 11/05/16 1905 11/05/16 2003       Radiology Studies: Dg Chest 2 View  Result Date: 11/05/2016 CLINICAL DATA:  Fever.  Right flank pain. EXAM: CHEST  2 VIEW COMPARISON:  10/15/2014 chest radiograph. FINDINGS: Stable cardiomediastinal silhouette with normal heart size. No pneumothorax. No pleural effusion. Lungs appear clear, with no acute consolidative airspace disease and no pulmonary edema. IMPRESSION: No active  cardiopulmonary disease. Electronically Signed   By: Ilona Sorrel M.D.   On: 11/05/2016 17:54   Ct Abdomen Pelvis W Contrast  Result Date: 11/05/2016 CLINICAL DATA:  Fever. Right flank pain. History of renal abscess. Self catheterization. EXAM: CT ABDOMEN AND PELVIS WITH CONTRAST TECHNIQUE: Multidetector CT imaging of the abdomen and pelvis was performed using the standard protocol following bolus administration of intravenous contrast. CONTRAST:  163mL ISOVUE-300 IOPAMIDOL (ISOVUE-300) INJECTION 61% COMPARISON:  12/23/2014 CT abdomen.  10/21/2014 CT abdomen/pelvis. FINDINGS: Lower chest: No significant pulmonary nodules or acute consolidative airspace disease. Hepatobiliary: Normal liver with no liver mass.  Normal gallbladder with no radiopaque cholelithiasis. No biliary ductal dilatation. Pancreas: Normal, with no mass or duct dilation. Spleen: Normal size. No mass. Adrenals/Urinary Tract: Normal adrenals. Mild right hydroureteronephrosis to the level of the right ureterovesical junction. Mild diffuse left ureterectasis without overt left hydronephrosis. Diffuse urothelial wall thickening and hyperenhancement throughout the ureters and central renal collecting systems bilaterally. Abnormal patchy enhancement throughout the right kidney. Several of the dilated right renal calices extend to the periphery of right renal cortex. Mild patchy enhancement in the medial upper left kidney. Multifocal mild to moderate renal cortical scarring throughout the right kidney. Perinephric fat stranding, asymmetrically prominent on the right. No perinephric fluid collections. No contour deforming renal masses. Diffuse bladder wall thickening and hyperenhancement. Suggestion of diffuse mild bladder trabeculation. No evidence of urolithiasis. Stomach/Bowel: Grossly normal stomach. Normal caliber small bowel with no small bowel wall thickening. Appendix not discretely visualized. No pericecal inflammatory changes. Normal large  bowel with no diverticulosis, large bowel wall thickening or pericolonic fat stranding. Vascular/Lymphatic: Normal caliber abdominal aorta. Patent portal, splenic and renal veins . No pathologically enlarged lymph nodes in the abdomen or pelvis. Reproductive: Minimally enlarged prostate. Prostate dimensions 3.1 x 3.5 x 5.0 cm (volume = 28 cm^3). Other: No pneumoperitoneum, ascites or focal fluid collection. Musculoskeletal: No aggressive appearing focal osseous lesions. IMPRESSION: 1. Diffusely thick walled, trabeculated hyperenhancing bladder wall, compatible with acute cystitis with probable underlying chronic bladder voiding dysfunction. 2. Mild right hydroureteronephrosis. Diffuse left ureterectasis without left hydronephrosis. Diffuse urothelial wall thickening and hyperenhancement in the renal collecting systems and ureters compatible with bilateral ascending urinary tract infection. 3. Bilateral acute pyelonephritis, significantly more severe in the right kidney. No discrete renal abscess or perinephric fluid collections. Pre-existing multifocal renal cortical scarring in the right kidney. 4. Minimally enlarged prostate. Electronically Signed   By: Ilona Sorrel M.D.   On: 11/05/2016 18:29        Scheduled Meds: . cefTRIAXone (ROCEPHIN)  IV  1 g Intravenous Q24H   Continuous Infusions: . sodium chloride 100 mL/hr at 11/06/16 0030     LOS: 1 day    Time spent: 25 min    [place], DO [facility] Pager [phone]  If 7PM-7AM, please contact night-coverage [email or web address] Password [facility] 11/06/2016, 10:47 AM

## 2016-11-06 NOTE — Progress Notes (Signed)
Received from ED.  Mother with patient. Asessment as charted.

## 2016-11-07 LAB — CBC
HCT: 32.8 % — ABNORMAL LOW (ref 39.0–52.0)
HEMOGLOBIN: 10.9 g/dL — AB (ref 13.0–17.0)
MCH: 32.7 pg (ref 26.0–34.0)
MCHC: 33.2 g/dL (ref 30.0–36.0)
MCV: 98.5 fL (ref 78.0–100.0)
Platelets: 104 10*3/uL — ABNORMAL LOW (ref 150–400)
RBC: 3.33 MIL/uL — AB (ref 4.22–5.81)
RDW: 12.9 % (ref 11.5–15.5)
WBC: 5 10*3/uL (ref 4.0–10.5)

## 2016-11-07 LAB — BASIC METABOLIC PANEL
ANION GAP: 9 (ref 5–15)
BUN: 13 mg/dL (ref 6–20)
CHLORIDE: 105 mmol/L (ref 101–111)
CO2: 25 mmol/L (ref 22–32)
Calcium: 8.5 mg/dL — ABNORMAL LOW (ref 8.9–10.3)
Creatinine, Ser: 1.33 mg/dL — ABNORMAL HIGH (ref 0.61–1.24)
GLUCOSE: 107 mg/dL — AB (ref 65–99)
POTASSIUM: 3.6 mmol/L (ref 3.5–5.1)
SODIUM: 139 mmol/L (ref 135–145)

## 2016-11-07 LAB — URINE CULTURE

## 2016-11-07 MED ORDER — CEPHALEXIN 500 MG PO CAPS: 500.0000 mg | ORAL_CAPSULE | Freq: Two times a day (BID) | ORAL | 0 refills | Status: DC

## 2016-11-07 MED ORDER — CEPHALEXIN 500 MG PO CAPS
500.0000 mg | ORAL_CAPSULE | Freq: Two times a day (BID) | ORAL | Status: DC
  Administered 2016-11-07: 500 mg via ORAL
  Filled 2016-11-07: qty 1

## 2016-11-07 NOTE — Progress Notes (Signed)
Pt discharge to home, discharge instructions, medications and prescriptions discussed and reviewed with pt and mother at bedside, verbalized understanding. Telemetry discontinued, CCMD notified. IV discontinued, catheter intact, site clean and dry. Pt was escorted out of the unit in wheelchair accompanied by mother, took all belongings with them.

## 2016-11-07 NOTE — Care Management Note (Signed)
Case Management Note  Patient Details  Name: Andrew Higgins MRN: [id number] Date of Birth: 02/28/1976  Subjective/Objective:                 Patient admitted from home with parents for pyelo. Will DC to home with PO abx.    Action/Plan:  DC to home with family today. No CM needs identified.  Expected Discharge Date:  11/07/16               Expected Discharge Plan:  Home/Self Care  In-House Referral:     Discharge planning Services  CM Consult  Post Acute Care Choice:    Choice offered to:     DME Arranged:    DME Agency:     HH Arranged:    HH Agency:     Status of Service:  Completed, signed off  If discussed at [employer] of Stay Meetings, dates discussed:    Additional Comments:  Carles Collet, RN 11/07/2016, 12:18 PM

## 2016-11-07 NOTE — Discharge Summary (Signed)
Physician Discharge Summary  Spence Soberano IRC:[id number] DOB: 14-Mar-1976 DOA: 11/05/2016  PCP: [name]  Admit date: 11/05/2016 Discharge date: 11/07/2016   Recommendations for Outpatient Follow-Up:   1. Instructions given to cath more frequently and sterile technique shown 2. BMP 1 week   Discharge Diagnosis:   Principal Problem:   Pyelonephritis Active Problems:   Hydronephrosis   ARF (acute renal failure) ([place])   Thrombocytopenia ([place])   Discharge disposition:  Home  Discharge Condition: Improved.  Diet recommendation:  Regular.  Wound care: None.   History of Present Illness:   Andrew Higgins is a 41 y.o. 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 Patient has been having this symptom for last 4 days. Denies any abdominal pain or diarrhea. Has had one episode of vomiting 2 days ago.  ED Course: In the ER patient was found to be febrile with CAT scan showing bilateral acute pyelonephritis with dry sterile hydronephrosis. On-call urologist Dr. Louis Meckel was consulted. At this time urologist advised no surgical intervention required and treat with antibiotics. Patienton IV ceftriaxone and blood cultures were obtained. Admitted for further observation.   Hospital Course by Problem:   Acute bilateral pyelonephritis, cystitis with right-sided hydronephrosis-- e coli (pan sensitive) -treat for 10 days with keflex  Acute renal failure -  -last creatinine  was in 2016 and was normal. Creatinine is increased from baseline -decreaseing with IVF -plan to d/c foley before discharge and continue I/O caths at home  Chronic anemia and thrombocytopenia - reviewing patient's old labs patient does have chronic anemia and thrombocytopenia. -MCV  about 100 LDH normal -outpatient follow up  Ependymoma - status post surgery and radiation therapy in 2016 and being followed at [facility].     Medical Consultants:    Urology (phone)   Discharge Exam:   Vitals:   11/07/16 0508 11/07/16 0900  BP: 117/70 120/74  Pulse: 67 66  Resp: 19 18  Temp: 98.4 F (36.9 C) 97.9 F (36.6 C)   Vitals:   11/06/16 1715 11/06/16 2110 11/07/16 0508 11/07/16 0900  BP: 111/63 (!) 108/57 117/70 120/74  Pulse: 73 78 67 66  Resp: 16 18 19 18   Temp: 98.8 F (37.1 C) 99 F (37.2 C) 98.4 F (36.9 C) 97.9 F (36.6 C)  TempSrc: Oral Oral Oral Oral  SpO2: 99% 98% 97% 100%  Weight:  70.3 kg (154 lb 15.7 oz)    Height:        Gen:  NAD- feeling much better    The results of significant diagnostics from this hospitalization (including imaging, microbiology, ancillary and laboratory) are listed below for reference.     Procedures and Diagnostic Studies:   Dg Chest 2 View  Result Date: 11/05/2016 CLINICAL DATA:  Fever.  Right flank pain. EXAM: CHEST  2 VIEW COMPARISON:  10/15/2014 chest radiograph. FINDINGS: Stable cardiomediastinal silhouette with normal heart size. No pneumothorax. No pleural effusion. Lungs appear clear, with no acute consolidative airspace disease and no pulmonary edema. IMPRESSION: No active cardiopulmonary disease. Electronically Signed   By: Ilona Sorrel M.D.   On: 11/05/2016 17:54   Ct Abdomen Pelvis W Contrast  Result Date: 11/05/2016 CLINICAL DATA:  Fever. Right flank pain. History of renal abscess. Self catheterization. EXAM: CT ABDOMEN AND PELVIS WITH CONTRAST TECHNIQUE:  Multidetector CT imaging of the abdomen and pelvis was performed using the standard protocol following bolus administration of intravenous contrast. CONTRAST:  160mL ISOVUE-300 IOPAMIDOL (ISOVUE-300) INJECTION 61% COMPARISON:  12/23/2014 CT abdomen.  10/21/2014 CT abdomen/pelvis. FINDINGS: Lower chest: No significant pulmonary  nodules or acute consolidative airspace disease. Hepatobiliary: Normal liver with no liver mass. Normal gallbladder with no radiopaque cholelithiasis. No biliary ductal dilatation. Pancreas: Normal, with no mass or duct dilation. Spleen: Normal size. No mass. Adrenals/Urinary Tract: Normal adrenals. Mild right hydroureteronephrosis to the level of the right ureterovesical junction. Mild diffuse left ureterectasis without overt left hydronephrosis. Diffuse urothelial wall thickening and hyperenhancement throughout the ureters and central renal collecting systems bilaterally. Abnormal patchy enhancement throughout the right kidney. Several of the dilated right renal calices extend to the periphery of right renal cortex. Mild patchy enhancement in the medial upper left kidney. Multifocal mild to moderate renal cortical scarring throughout the right kidney. Perinephric fat stranding, asymmetrically prominent on the right. No perinephric fluid collections. No contour deforming renal masses. Diffuse bladder wall thickening and hyperenhancement. Suggestion of diffuse mild bladder trabeculation. No evidence of urolithiasis. Stomach/Bowel: Grossly normal stomach. Normal caliber small bowel with no small bowel wall thickening. Appendix not discretely visualized. No pericecal inflammatory changes. Normal large bowel with no diverticulosis, large bowel wall thickening or pericolonic fat stranding. Vascular/Lymphatic: Normal caliber abdominal aorta. Patent portal, splenic and renal veins . No pathologically enlarged lymph nodes in the abdomen or pelvis. Reproductive: Minimally enlarged prostate. Prostate dimensions 3.1 x 3.5 x 5.0 cm (volume = 28 cm^3). Other: No pneumoperitoneum, ascites or focal fluid collection. Musculoskeletal: No aggressive appearing focal osseous lesions. IMPRESSION: 1. Diffusely thick walled, trabeculated hyperenhancing bladder wall, compatible with acute cystitis with probable underlying chronic bladder  voiding dysfunction. 2. Mild right hydroureteronephrosis. Diffuse left ureterectasis without left hydronephrosis. Diffuse urothelial wall thickening and hyperenhancement in the renal collecting systems and ureters compatible with bilateral ascending urinary tract infection. 3. Bilateral acute pyelonephritis, significantly more severe in the right kidney. No discrete renal abscess or perinephric fluid collections. Pre-existing multifocal renal cortical scarring in the right kidney. 4. Minimally enlarged prostate. Electronically Signed   By: Ilona Sorrel M.D.   On: 11/05/2016 18:29     Labs:   Basic Metabolic Panel:  Recent Labs Lab 11/05/16 1600 11/06/16 0458 11/07/16 0455  NA 131* 136 139  K 3.5 3.5 3.6  CL 97* 99* 105  CO2 26 26 25   GLUCOSE 142* 118* 107*  BUN 19 17 13   CREATININE 1.84* 1.58* 1.33*  CALCIUM 8.7* 8.1* 8.5*   GFR Estimated Creatinine Clearance: 74.9 mL/min (A) (by C-G formula based on SCr of 1.33 mg/dL (H)). Liver Function Tests:  Recent Labs Lab 11/05/16 1600  AST 41  ALT 36  ALKPHOS 66  BILITOT 0.1*  PROT 6.8  ALBUMIN 3.2*    Recent Labs Lab 11/05/16 1600  LIPASE 18   No results for input(s): AMMONIA in the last 168 hours. Coagulation profile No results for input(s): INR, PROTIME in the last 168 hours.  CBC:  Recent Labs Lab 11/05/16 1600 11/05/16 1606 11/06/16 0458 11/07/16 0455  WBC 3.7*  --  4.1 5.0  NEUTROABS  --  2.8  --   --   HGB 12.3*  --  10.4* 10.9*  HCT 35.7*  --  31.4* 32.8*  MCV 98.3  --  98.4 98.5  PLT 89*  --  93* 104*   Cardiac Enzymes: No results for input(s): CKTOTAL, CKMB, CKMBINDEX, TROPONINI in  the last 168 hours. BNP: Invalid input(s): POCBNP CBG: No results for input(s): GLUCAP in the last 168 hours. D-Dimer No results for input(s): DDIMER in the last 72 hours. Hgb A1c No results for input(s): HGBA1C in the last 72 hours. Lipid Profile No results for input(s): CHOL, HDL, LDLCALC, TRIG, CHOLHDL, LDLDIRECT  in the last 72 hours. Thyroid function studies No results for input(s): TSH, T4TOTAL, T3FREE, THYROIDAB in the last 72 hours.  Invalid input(s): FREET3 Anemia work up  Recent Labs  11/05/16 2239  VITAMINB12 470  FOLATE 23.0  FERRITIN 809*  TIBC 216*  IRON 14*  RETICCTPCT <0.4*   Microbiology Recent Results (from the past 240 hour(s))  Culture, blood (Routine X 2) w Reflex to ID Panel     Status: None (Preliminary result)   Collection Time: 11/05/16  4:15 PM  Result Value Ref Range Status   Specimen Description BLOOD LEFT ANTECUBITAL  Final   Special Requests   Final    BOTTLES DRAWN AEROBIC AND ANAEROBIC Blood Culture results may not be optimal due to an inadequate volume of blood received in culture bottles   Culture NO GROWTH 2 DAYS  Final   Report Status PENDING  Incomplete  Culture, blood (Routine X 2) w Reflex to ID Panel     Status: None (Preliminary result)   Collection Time: 11/05/16  4:18 PM  Result Value Ref Range Status   Specimen Description BLOOD RIGHT ANTECUBITAL  Final   Special Requests   Final    BOTTLES DRAWN AEROBIC AND ANAEROBIC Blood Culture adequate volume   Culture NO GROWTH 2 DAYS  Final   Report Status PENDING  Incomplete  Urine culture     Status: Abnormal   Collection Time: 11/05/16  6:36 PM  Result Value Ref Range Status   Specimen Description URINE, RANDOM  Final   Special Requests NONE  Final   Culture >=100,000 COLONIES/mL ESCHERICHIA COLI (A)  Final   Report Status 11/07/2016 FINAL  Final   Organism ID, Bacteria ESCHERICHIA COLI (A)  Final      Susceptibility   Escherichia coli - MIC*    AMPICILLIN <=2 SENSITIVE Sensitive     CEFAZOLIN <=4 SENSITIVE Sensitive     CEFTRIAXONE <=1 SENSITIVE Sensitive     CIPROFLOXACIN <=0.25 SENSITIVE Sensitive     GENTAMICIN <=1 SENSITIVE Sensitive     IMIPENEM <=0.25 SENSITIVE Sensitive     NITROFURANTOIN <=16 SENSITIVE Sensitive     TRIMETH/SULFA <=20 SENSITIVE Sensitive     AMPICILLIN/SULBACTAM  <=2 SENSITIVE Sensitive     PIP/TAZO <=4 SENSITIVE Sensitive     Extended ESBL NEGATIVE Sensitive     * >=100,000 COLONIES/mL ESCHERICHIA COLI     Discharge Instructions:   Discharge Instructions    Diet general    Complete by:  As directed    Discharge instructions    Complete by:  As directed    May return to work on Thursday BMP with PCP in 1 week  Be sure to complete all antibiotics   Increase activity slowly    Complete by:  As directed      Allergies as of 11/07/2016      Reactions   Phenobarbital Hives   Fish Allergy Swelling, Other (See Comments)   Tongue swelling, lethargy      Medication List    TAKE these medications   acetaminophen 500 MG tablet Commonly known as:  TYLENOL Take 500 mg by mouth every 6 (six) hours as needed for moderate  pain.   cephALEXin 500 MG capsule Commonly known as:  KEFLEX Take 1 capsule (500 mg total) by mouth every 12 (twelve) hours.   multivitamin with minerals Tabs tablet Take 1 tablet by mouth daily.      Follow-up Information    WILLARD,JENNIFER, PA-C Follow up in 1 week(s).   Specialty:  Family Medicine Contact information: [place] [address] [phone]            Time coordinating discharge: 35 min  Signed:  Lanessa Shill Alison Stalling   [facility] 11/07/2016, 11:55 AM

## 2016-11-11 LAB — CULTURE, BLOOD (ROUTINE X 2)
CULTURE: NO GROWTH
CULTURE: NO GROWTH
Special Requests: ADEQUATE

## 2016-11-13 DIAGNOSIS — Z789 Other specified health status: Secondary | ICD-10-CM | POA: Diagnosis not present

## 2016-11-13 DIAGNOSIS — N10 Acute tubulo-interstitial nephritis: Secondary | ICD-10-CM | POA: Diagnosis not present

## 2016-11-13 DIAGNOSIS — D649 Anemia, unspecified: Secondary | ICD-10-CM | POA: Diagnosis not present

## 2016-11-13 DIAGNOSIS — D696 Thrombocytopenia, unspecified: Secondary | ICD-10-CM | POA: Diagnosis not present

## 2016-11-13 DIAGNOSIS — D432 Neoplasm of uncertain behavior of brain, unspecified: Secondary | ICD-10-CM | POA: Diagnosis not present

## 2016-12-08 DIAGNOSIS — B9620 Unspecified Escherichia coli [E. coli] as the cause of diseases classified elsewhere: Secondary | ICD-10-CM | POA: Diagnosis not present

## 2016-12-08 DIAGNOSIS — N390 Urinary tract infection, site not specified: Secondary | ICD-10-CM | POA: Diagnosis not present

## 2016-12-08 DIAGNOSIS — N319 Neuromuscular dysfunction of bladder, unspecified: Secondary | ICD-10-CM | POA: Diagnosis not present

## 2016-12-20 DIAGNOSIS — C720 Malignant neoplasm of spinal cord: Secondary | ICD-10-CM | POA: Diagnosis not present

## 2016-12-20 DIAGNOSIS — D432 Neoplasm of uncertain behavior of brain, unspecified: Secondary | ICD-10-CM | POA: Diagnosis not present

## 2016-12-20 DIAGNOSIS — C719 Malignant neoplasm of brain, unspecified: Secondary | ICD-10-CM | POA: Diagnosis not present

## 2017-02-12 DIAGNOSIS — R339 Retention of urine, unspecified: Secondary | ICD-10-CM | POA: Diagnosis not present

## 2017-02-19 DIAGNOSIS — N319 Neuromuscular dysfunction of bladder, unspecified: Secondary | ICD-10-CM | POA: Diagnosis not present

## 2017-02-19 DIAGNOSIS — N390 Urinary tract infection, site not specified: Secondary | ICD-10-CM | POA: Diagnosis not present

## 2017-02-19 DIAGNOSIS — R339 Retention of urine, unspecified: Secondary | ICD-10-CM | POA: Diagnosis not present

## 2017-02-22 ENCOUNTER — Emergency Department (HOSPITAL_COMMUNITY): Payer: Worker's Compensation

## 2017-02-22 ENCOUNTER — Encounter (HOSPITAL_COMMUNITY): Payer: Self-pay

## 2017-02-22 ENCOUNTER — Emergency Department (HOSPITAL_COMMUNITY)
Admission: EM | Admit: 2017-02-22 | Discharge: 2017-02-22 | Disposition: A | Discharge status: Home / Self Care | Payer: Worker's Compensation | Attending: Emergency Medicine | Admitting: Emergency Medicine

## 2017-02-22 DIAGNOSIS — W260XXA Contact with knife, initial encounter: Secondary | ICD-10-CM | POA: Diagnosis not present

## 2017-02-22 DIAGNOSIS — S61211A Laceration without foreign body of left index finger without damage to nail, initial encounter: Secondary | ICD-10-CM | POA: Diagnosis not present

## 2017-02-22 DIAGNOSIS — Z79899 Other long term (current) drug therapy: Secondary | ICD-10-CM | POA: Diagnosis not present

## 2017-02-22 DIAGNOSIS — Y9389 Activity, other specified: Secondary | ICD-10-CM | POA: Diagnosis not present

## 2017-02-22 DIAGNOSIS — Y929 Unspecified place or not applicable: Secondary | ICD-10-CM | POA: Insufficient documentation

## 2017-02-22 DIAGNOSIS — Y999 Unspecified external cause status: Secondary | ICD-10-CM | POA: Diagnosis not present

## 2017-02-22 DIAGNOSIS — S6992XA Unspecified injury of left wrist, hand and finger(s), initial encounter: Secondary | ICD-10-CM | POA: Diagnosis present

## 2017-02-22 MED ORDER — CEPHALEXIN 500 MG PO CAPS: 500.0000 mg | ORAL_CAPSULE | Freq: Two times a day (BID) | ORAL | 0 refills | Status: AC

## 2017-02-22 MED ORDER — LIDOCAINE HCL (PF) 1 % IJ SOLN
30.0000 mL | Freq: Once | INTRAMUSCULAR | Status: DC
  Filled 2017-02-22: qty 30

## 2017-02-22 NOTE — Discharge Instructions (Signed)
Please read attached information regarding wound care. Take Keflex twice daily for 1 week. Return in 10 days for suture removal. Sooner if any signs of infection including drainage, increased bleeding, numbness, redness or warmth of the area, fevers.

## 2017-02-22 NOTE — ED Triage Notes (Signed)
Patient here with laceration to left hand index finger after cutting with knife while working this am, bleeding controlled with dressing, NAD

## 2017-02-22 NOTE — ED Notes (Signed)
Patient returned from xray.

## 2017-02-22 NOTE — ED Provider Notes (Signed)
[facility] DEPT Provider Note   CSN: [id number] Arrival date & time: 02/22/17  0355     History   Chief Complaint Chief Complaint  Patient presents with  . finger lac    HPI Andrew Higgins is a 41 y.o. male.  HPI  Patient presents to ED for evaluation of the left index finger laceration that occurred just prior to arrival. He states that he was using a freshly sharpened knife when he accidentally cut his finger. He reports he immediately wrapped the area and came to the emergency room. He denies any previous history of injury to the area. States that his last tetanus was 3 years ago.  Past Medical History:  Diagnosis Date  . 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 Hard of hearing   . 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 Pyelonephritis 11/05/2016  . ARF (acute renal failure) ([place]) 11/05/2016  . Thrombocytopenia ([place]) 11/05/2016  . 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 Hydronephrosis 10/21/2014  . Urinary retention 10/21/2014  . Sepsis ([place]) 10/21/2014  . Renal abscess     Past Surgical History:  Procedure Laterality Date  . TUMOR REMOVAL  2016   SPINAL TUMOR-PARTIALLY REMOVED       Home Medications    Prior to Admission medications   Medication Sig Start Date End Date Taking? Authorizing Provider  acetaminophen (TYLENOL) 500 MG tablet Take 500 mg by mouth every 6 (six) hours as needed for moderate pain.    [provider]  cephALEXin (KEFLEX) 500 MG capsule Take 1 capsule (500 mg total) by mouth 2 (two) times daily. 02/22/17 03/01/17  Delia Heady, PA-C  Multiple Vitamin (MULTIVITAMIN WITH MINERALS) TABS tablet Take 1 tablet by mouth daily.    [provider]    Family History Family History  Problem Relation Age of Onset  . CAD Father   . Diabetes Mellitus II Neg Hx     Social History Social History  Substance Use  Topics  . Smoking status: Never Smoker  . Smokeless tobacco: Never Used  . Alcohol use No     Allergies   Phenobarbital and Fish allergy   Review of Systems Review of Systems  Constitutional: Negative for chills and fever.  Gastrointestinal: Negative for nausea and vomiting.  Musculoskeletal: Negative for arthralgias and myalgias.  Skin: Positive for wound.     Physical Exam Updated Vital Signs BP 134/86   Pulse 81   Temp 98 F (36.7 C) (Oral)   Resp 16   Ht 6\' 3"  (1.905 m)   Wt 77.1 kg (170 lb)   SpO2 98%   BMI 21.25 kg/m   Physical Exam  Constitutional: He appears well-developed and well-nourished. No distress.  HENT:  Head: Normocephalic and atraumatic.  Eyes: Conjunctivae and EOM are normal. No scleral icterus.  Neck: Normal range of motion.  Pulmonary/Chest: Effort normal. No respiratory distress.  Neurological: He is alert.  Skin: No rash noted. He is not diaphoretic.  2 cm laceration on the palmar side of the left index finger. Sensation intact to light touch. Full active and passive range of motion of the digit. 2+ radial pulse.  Psychiatric: He has a normal mood and affect.  Nursing note and vitals reviewed.    ED Treatments / Results  Labs (all labs ordered are listed, but only abnormal results are displayed) Labs Reviewed -  No data to display  EKG  EKG Interpretation None       Radiology Dg Finger Index Left  Result Date: 02/22/2017 CLINICAL DATA:  Laceration of the anterior distal aspect of the left index finger are. EXAM: LEFT INDEX FINGER 2+V COMPARISON:  Left hand series dated October 18, 2013 FINDINGS: Three views of the index finger reveal the bones to be adequately mineralized. There is no acute fracture or cortical disruption. The joint spaces are well maintained. There is a deep laceration involving the ventral aspect of the pad of the index finger. No foreign body is observed. IMPRESSION: Deep laceration of the pad of the left index  finger. No foreign body or underlying bony abnormality is observed. Electronically Signed   By: David  Martinique M.D.   On: 02/22/2017 10:24    Procedures .Marland KitchenLaceration Repair Date/Time: 02/22/2017 10:17 AM Performed by: Delia Heady Authorized by: Delia Heady   Consent:    Consent obtained:  Verbal   Consent given by:  Patient   Risks discussed:  Infection, pain, retained foreign body and poor cosmetic result Anesthesia (see MAR for exact dosages):    Anesthesia method:  Nerve block   Block location:  Left index finger   Block anesthetic:  Lidocaine 1% w/o epi Laceration details:    Location:  Finger   Finger location:  L index finger   Length (cm):  2 Repair type:    Repair type:  Simple Pre-procedure details:    Preparation:  Patient was prepped and draped in usual sterile fashion Exploration:    Hemostasis achieved with:  Direct pressure   Wound exploration: wound explored through full range of motion   Treatment:    Area cleansed with:  Saline and Betadine   Amount of cleaning:  Extensive   Irrigation solution:  Sterile saline   Irrigation method:  Syringe Skin repair:    Repair method:  Sutures   Suture size:  4-0   Wound skin closure material used: ethilon.   Suture technique:  Simple interrupted   Number of sutures:  5 Approximation:    Approximation:  Close Post-procedure details:    Patient tolerance of procedure:  Tolerated well, no immediate complications     (including critical care time)  Medications Ordered in ED Medications  lidocaine (PF) (XYLOCAINE) 1 % injection 30 mL (not administered)     Initial Impression / Assessment and Plan / ED Course  I have reviewed the triage vital signs and the nursing notes.  Pertinent labs & imaging results that were available during my care of the patient were reviewed by me and considered in my medical decision making (see chart for details).     Patient presents to ED for evaluation of left index finger  laceration that occurred justprior to arrival at work. He states that he was using a knife when the knife lacerated his finger. He reports bleeding was controlled with gauze and adhesive bandage. X-ray done and was negative for fracture, dislocation or foreign body. Sensation intact to light touch on either side of wound. Area was extensively cleaned with Betadine and saline. Repaired with sutures. Patient given information for wound care and advised to return for suture removal in 10 days. Given Keflex to prevent any further infection since knife was dirty. Patient appears stable for discharge at this time. Strict return precautions given.  Final Clinical Impressions(s) / ED Diagnoses   Final diagnoses:  Laceration of left index finger without foreign body without damage to nail,  initial encounter    New Prescriptions New Prescriptions   CEPHALEXIN (KEFLEX) 500 MG CAPSULE    Take 1 capsule (500 mg total) by mouth 2 (two) times daily.     Delia Heady, PA-C 02/22/17 1118    Lajean Saver, MD 02/22/17 1212

## 2017-02-22 NOTE — ED Notes (Signed)
Patient soaking finger in betadine and saline solution.

## 2017-04-04 DIAGNOSIS — D649 Anemia, unspecified: Secondary | ICD-10-CM | POA: Diagnosis not present

## 2017-04-04 DIAGNOSIS — Z0000 Encounter for general adult medical examination without abnormal findings: Secondary | ICD-10-CM | POA: Diagnosis not present

## 2017-06-22 DIAGNOSIS — D432 Neoplasm of uncertain behavior of brain, unspecified: Secondary | ICD-10-CM | POA: Diagnosis not present

## 2017-06-22 DIAGNOSIS — Z923 Personal history of irradiation: Secondary | ICD-10-CM | POA: Diagnosis not present

## 2017-06-22 DIAGNOSIS — C720 Malignant neoplasm of spinal cord: Secondary | ICD-10-CM | POA: Diagnosis not present

## 2017-08-24 DIAGNOSIS — M778 Other enthesopathies, not elsewhere classified: Secondary | ICD-10-CM | POA: Diagnosis not present

## 2017-09-03 DIAGNOSIS — J069 Acute upper respiratory infection, unspecified: Secondary | ICD-10-CM | POA: Diagnosis not present

## 2017-12-21 DIAGNOSIS — D432 Neoplasm of uncertain behavior of brain, unspecified: Secondary | ICD-10-CM | POA: Diagnosis not present

## 2017-12-21 DIAGNOSIS — C720 Malignant neoplasm of spinal cord: Secondary | ICD-10-CM | POA: Diagnosis not present

## 2018-01-24 ENCOUNTER — Encounter (HOSPITAL_COMMUNITY): Payer: Self-pay | Admitting: Emergency Medicine

## 2018-01-24 ENCOUNTER — Emergency Department (HOSPITAL_COMMUNITY)
Admission: EM | Admit: 2018-01-24 | Discharge: 2018-01-24 | Disposition: A | Discharge status: Home / Self Care | Payer: 59 | Attending: Emergency Medicine | Admitting: Emergency Medicine

## 2018-01-24 ENCOUNTER — Emergency Department (HOSPITAL_COMMUNITY): Payer: 59

## 2018-01-24 ENCOUNTER — Other Ambulatory Visit: Payer: Self-pay

## 2018-01-24 DIAGNOSIS — Z87820 Personal history of traumatic brain injury: Secondary | ICD-10-CM | POA: Diagnosis not present

## 2018-01-24 DIAGNOSIS — N451 Epididymitis: Secondary | ICD-10-CM | POA: Diagnosis not present

## 2018-01-24 DIAGNOSIS — N433 Hydrocele, unspecified: Secondary | ICD-10-CM | POA: Diagnosis not present

## 2018-01-24 DIAGNOSIS — Z85848 Personal history of malignant neoplasm of other parts of nervous tissue: Secondary | ICD-10-CM | POA: Insufficient documentation

## 2018-01-24 DIAGNOSIS — N50811 Right testicular pain: Secondary | ICD-10-CM | POA: Diagnosis present

## 2018-01-24 DIAGNOSIS — I861 Scrotal varices: Secondary | ICD-10-CM | POA: Diagnosis not present

## 2018-01-24 LAB — URINALYSIS, ROUTINE W REFLEX MICROSCOPIC
Bilirubin Urine: NEGATIVE
Glucose, UA: NEGATIVE mg/dL
Ketones, ur: NEGATIVE mg/dL
Nitrite: POSITIVE — AB
Protein, ur: 30 mg/dL — AB
Specific Gravity, Urine: 1.015 (ref 1.005–1.030)
pH: 7 (ref 5.0–8.0)

## 2018-01-24 LAB — URINALYSIS, MICROSCOPIC (REFLEX): WBC, UA: >50 WBC/hpf (ref 0–5)

## 2018-01-24 MED ORDER — LEVOFLOXACIN 500 MG PO TABS: 500.0000 mg | ORAL_TABLET | Freq: Every day | ORAL | 0 refills | Status: DC

## 2018-01-24 MED ORDER — OXYCODONE-ACETAMINOPHEN 5-325 MG PO TABS
1.0000 | ORAL_TABLET | Freq: Once | ORAL | Status: AC
  Administered 2018-01-24: 1 via ORAL
  Filled 2018-01-24: qty 1

## 2018-01-24 MED ORDER — TRAMADOL HCL 50 MG PO TABS: 50.0000 mg | ORAL_TABLET | Freq: Four times a day (QID) | ORAL | 0 refills | Status: DC | PRN

## 2018-01-24 NOTE — ED Provider Notes (Signed)
[facility] EMERGENCY DEPARTMENT Provider Note   CSN: [id number] Arrival date & time: 01/24/18  1214     History   Chief Complaint Chief Complaint  Patient presents with  . Testicle Pain    HPI Andrew Higgins is a 42 y.o. male.  HPI  42 year old male presents today with complaints of right testicular pain. Patient notes a pain in the right testicle starting yesterday while at work. He notes this was very minor, he notes associated swelling to the testicle and worsening pain over the last day. He denies any penile discharge, denies any dysuria, change in color or odor of his urine ( patient self caths)- he denies any associated abdominal pain. Patient notes bilateral inguinal hernia repairs when he was in middle school. Patient denies any fever or chills nausea or vomiting. Patient is not sexually active.  Past Medical History:  Diagnosis Date  . 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 Hard of hearing   . TBI (traumatic brain injury) [facility])    age 62- steel chair, seizures-- dilantin    Patient Active Problem List   Diagnosis Date Noted  . Pyelonephritis 11/05/2016  . ARF (acute renal failure) ([place]) 11/05/2016  . Thrombocytopenia ([place]) 11/05/2016  . 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 Hydronephrosis 10/21/2014  . Urinary retention 10/21/2014  . Sepsis ([place]) 10/21/2014  . Renal abscess     Past Surgical History:  Procedure Laterality Date  . TUMOR REMOVAL  2016   SPINAL TUMOR-PARTIALLY REMOVED        Home Medications    Prior to Admission medications   Medication Sig Start Date End Date Taking? Authorizing Provider  acetaminophen (TYLENOL) 500 MG tablet Take 500 mg by mouth every 6 (six) hours as needed for moderate pain.    [provider]  levofloxacin (LEVAQUIN) 500 MG tablet Take 1 tablet (500 mg total) by mouth daily. 01/24/18   Lindsi Bayliss, Dellis Filbert, PA-C  Multiple Vitamin (MULTIVITAMIN WITH MINERALS) TABS tablet Take  1 tablet by mouth daily.    [provider]  traMADol (ULTRAM) 50 MG tablet Take 1 tablet (50 mg total) by mouth every 6 (six) hours as needed. 01/24/18   Okey Regal, PA-C    Family History Family History  Problem Relation Age of Onset  . CAD Father   . Diabetes Mellitus II Neg Hx     Social History Social History   Tobacco Use  . Smoking status: Never Smoker  . Smokeless tobacco: Never Used  Substance Use Topics  . Alcohol use: No  . Drug use: No     Allergies   Phenobarbital and Fish allergy   Review of Systems Review of Systems  All other systems reviewed and are negative.    Physical Exam Updated Vital Signs BP 114/74   Pulse 81   Temp 98 F (36.7 C) (Oral)   Resp 16   SpO2 100%   Physical Exam  Constitutional: He is oriented to person, place, and time. He appears well-developed and well-nourished.  HENT:  Head: Normocephalic and atraumatic.  Eyes: Pupils are equal, round, and reactive to light. Conjunctivae are normal. Right eye exhibits no discharge. Left eye exhibits no discharge. No scleral icterus.  Neck: Normal range of motion. No JVD present. No tracheal deviation present.  Pulmonary/Chest: Effort normal. No stridor.  Genitourinary:  Genitourinary Comments: Right testicle with swelling noted along the epididymis with exquisite tenderness to palpations scrotum without redness or warmth to touch, no hernias  noted, no penile discharge  Neurological: He is alert and oriented to person, place, and time. Coordination normal.  Psychiatric: He has a normal mood and affect. His behavior is normal. Judgment and thought content normal.  Nursing note and vitals reviewed.   ED Treatments / Results  Labs (all labs ordered are listed, but only abnormal results are displayed) Labs Reviewed  URINE CULTURE  URINALYSIS, ROUTINE W REFLEX MICROSCOPIC  GC/CHLAMYDIA PROBE AMP ([place]) NOT AT [facility]    EKG None  Radiology US Scrotum  W/doppler  Result Date: 01/24/2018 CLINICAL DATA:  Right scrotal pain and swelling for 1 day. EXAM: SCROTAL ULTRASOUND DOPPLER ULTRASOUND OF THE TESTICLES TECHNIQUE: Complete ultrasound examination of the testicles, epididymis, and other scrotal structures was performed. Color and spectral Doppler ultrasound were also utilized to evaluate blood flow to the testicles. COMPARISON:  CT scan 11/05/2016 FINDINGS: Right testicle Measurements: 4.0 x 1.8 x 2.9 cm. Symmetric and homogeneous echotexture without focal lesion. Patent arterial and venous blood flow. Left testicle Measurements: 3.5 x 1.9 x 2.8 cm. Symmetric and homogeneous echotexture without focal lesion. A few tiny vascular appearing calcifications possibly due to prior inflammation or infection. Patent arterial and venous blood flow. Right epididymis: Enlarged and hypervascular with a 8 mm epididymal cyst. Findings suggest epididymitis. Left epididymis:  Normal in size and appearance. Hydrocele:  Small right hydrocele Varicocele:  Left-sided varicocele Pulsed Doppler interrogation of both testes demonstrates normal low resistance arterial and venous waveforms bilaterally. IMPRESSION: 1. Ultrasound findings consistent with right-sided epididymitis, likely explaining the patient's right-sided scrotal pain and swelling. 2. Normal appearance of both testicles except for a few possible vascular calcifications on the left. No worrisome testicular lesions. 3. Small right-sided hydrocele. 4. Left-sided varicocele. Electronically Signed   By: Marijo Sanes M.D.   On: 01/24/2018 13:21    Procedures Procedures (including critical care time)  Medications Ordered in ED Medications  oxyCODONE-acetaminophen (PERCOCET/ROXICET) 5-325 MG per tablet 1 tablet (1 tablet Oral Given 01/24/18 1242)     Initial Impression / Assessment and Plan / ED Course  I have reviewed the triage vital signs and the nursing notes.  Pertinent labs & imaging results that were available  during my care of the patient were reviewed by me and considered in my medical decision making (see chart for details).     42 year old male presents today with testicular pain likely secondary to epididymitis. Ultrasound confirms this no signs of torsion. Patient is not sexually active and has not been for approximately 9 months. He does self cath, he will be treated with Levaquin for presumed Escherichia coli infection, Pt discharged with strict return precautions.   Final Clinical Impressions(s) / ED Diagnoses   Final diagnoses:  Epididymitis    ED Discharge Orders        Ordered    levofloxacin (LEVAQUIN) 500 MG tablet  Daily     01/24/18 1409    traMADol (ULTRAM) 50 MG tablet  Every 6 hours PRN     01/24/18 1409       Okey Regal, PA-C 01/24/18 1441    Hayden Rasmussen, MD 01/25/18 0900

## 2018-01-24 NOTE — Discharge Instructions (Signed)
Please read attached information. If you experience any new or worsening signs or symptoms please return to the emergency room for evaluation. Please follow-up with your primary care provider or specialist as discussed. Please use medication prescribed only as directed and discontinue taking if you have any concerning signs or symptoms.   °

## 2018-01-24 NOTE — ED Triage Notes (Signed)
Patient complains of sudden onset of increasingly severe right testicle pain and swelling since yesterday. Denies injury. Patient alert, oriented, and ambulating independently with steady gait.

## 2018-01-26 ENCOUNTER — Inpatient Hospital Stay (HOSPITAL_COMMUNITY)
Admission: EM | Admit: 2018-01-26 | Discharge: 2018-01-30 | DRG: 728 | Disposition: A | Discharge status: Home / Self Care | Payer: 59 | Attending: Internal Medicine | Admitting: Internal Medicine

## 2018-01-26 ENCOUNTER — Other Ambulatory Visit: Payer: Self-pay

## 2018-01-26 ENCOUNTER — Encounter (HOSPITAL_COMMUNITY): Payer: Self-pay | Admitting: Emergency Medicine

## 2018-01-26 DIAGNOSIS — N319 Neuromuscular dysfunction of bladder, unspecified: Secondary | ICD-10-CM | POA: Diagnosis present

## 2018-01-26 DIAGNOSIS — N451 Epididymitis: Secondary | ICD-10-CM | POA: Diagnosis present

## 2018-01-26 DIAGNOSIS — N50819 Testicular pain, unspecified: Secondary | ICD-10-CM | POA: Diagnosis not present

## 2018-01-26 DIAGNOSIS — H9190 Unspecified hearing loss, unspecified ear: Secondary | ICD-10-CM | POA: Diagnosis present

## 2018-01-26 DIAGNOSIS — N433 Hydrocele, unspecified: Secondary | ICD-10-CM | POA: Diagnosis present

## 2018-01-26 DIAGNOSIS — Z79899 Other long term (current) drug therapy: Secondary | ICD-10-CM

## 2018-01-26 DIAGNOSIS — D649 Anemia, unspecified: Secondary | ICD-10-CM | POA: Diagnosis present

## 2018-01-26 DIAGNOSIS — N452 Orchitis: Secondary | ICD-10-CM | POA: Diagnosis not present

## 2018-01-26 DIAGNOSIS — D696 Thrombocytopenia, unspecified: Secondary | ICD-10-CM | POA: Diagnosis present

## 2018-01-26 DIAGNOSIS — N453 Epididymo-orchitis: Secondary | ICD-10-CM | POA: Diagnosis not present

## 2018-01-26 DIAGNOSIS — N3000 Acute cystitis without hematuria: Secondary | ICD-10-CM | POA: Diagnosis not present

## 2018-01-26 DIAGNOSIS — Z87820 Personal history of traumatic brain injury: Secondary | ICD-10-CM

## 2018-01-26 LAB — URINE CULTURE: Culture: >=100000 — AB

## 2018-01-26 NOTE — ED Triage Notes (Signed)
Pt states he was seen here on July 4 and dx with epididymitis.  Has been taking antibiotics as prescribed but feels he is getting worse.

## 2018-01-27 ENCOUNTER — Telehealth: Payer: Self-pay

## 2018-01-27 ENCOUNTER — Emergency Department (HOSPITAL_COMMUNITY): Payer: 59

## 2018-01-27 ENCOUNTER — Encounter (HOSPITAL_COMMUNITY): Payer: Self-pay | Admitting: Internal Medicine

## 2018-01-27 DIAGNOSIS — Z79899 Other long term (current) drug therapy: Secondary | ICD-10-CM | POA: Diagnosis not present

## 2018-01-27 DIAGNOSIS — N451 Epididymitis: Secondary | ICD-10-CM | POA: Diagnosis present

## 2018-01-27 DIAGNOSIS — D696 Thrombocytopenia, unspecified: Secondary | ICD-10-CM | POA: Diagnosis present

## 2018-01-27 DIAGNOSIS — D649 Anemia, unspecified: Secondary | ICD-10-CM | POA: Diagnosis present

## 2018-01-27 DIAGNOSIS — N453 Epididymo-orchitis: Secondary | ICD-10-CM | POA: Diagnosis present

## 2018-01-27 DIAGNOSIS — N319 Neuromuscular dysfunction of bladder, unspecified: Secondary | ICD-10-CM | POA: Diagnosis present

## 2018-01-27 DIAGNOSIS — N50819 Testicular pain, unspecified: Secondary | ICD-10-CM | POA: Diagnosis present

## 2018-01-27 DIAGNOSIS — H9190 Unspecified hearing loss, unspecified ear: Secondary | ICD-10-CM | POA: Diagnosis present

## 2018-01-27 DIAGNOSIS — Z87820 Personal history of traumatic brain injury: Secondary | ICD-10-CM | POA: Diagnosis not present

## 2018-01-27 DIAGNOSIS — N433 Hydrocele, unspecified: Secondary | ICD-10-CM | POA: Diagnosis present

## 2018-01-27 LAB — CBC WITH DIFFERENTIAL/PLATELET
Abs Immature Granulocytes: 0 10*3/uL (ref 0.0–0.1)
Basophils Absolute: 0 10*3/uL (ref 0.0–0.1)
Basophils Relative: 0 %
EOS ABS: 0.1 10*3/uL (ref 0.0–0.7)
Eosinophils Relative: 2 %
HEMATOCRIT: 36.7 % — AB (ref 39.0–52.0)
Hemoglobin: 11.8 g/dL — ABNORMAL LOW (ref 13.0–17.0)
IMMATURE GRANULOCYTES: 0 %
LYMPHS ABS: 0.9 10*3/uL (ref 0.7–4.0)
Lymphocytes Relative: 13 %
MCH: 33.7 pg (ref 26.0–34.0)
MCHC: 32.2 g/dL (ref 30.0–36.0)
MCV: 104.9 fL — ABNORMAL HIGH (ref 78.0–100.0)
Monocytes Absolute: 0.7 10*3/uL (ref 0.1–1.0)
Monocytes Relative: 10 %
NEUTROS PCT: 75 %
Neutro Abs: 5.2 10*3/uL (ref 1.7–7.7)
Platelets: 128 10*3/uL — ABNORMAL LOW (ref 150–400)
RBC: 3.5 MIL/uL — AB (ref 4.22–5.81)
RDW: 11.5 % (ref 11.5–15.5)
WBC: 7.1 10*3/uL (ref 4.0–10.5)

## 2018-01-27 LAB — URINALYSIS, ROUTINE W REFLEX MICROSCOPIC
Bacteria, UA: NONE SEEN
Bilirubin Urine: NEGATIVE
GLUCOSE, UA: NEGATIVE mg/dL
KETONES UR: NEGATIVE mg/dL
Nitrite: NEGATIVE
PH: 6 (ref 5.0–8.0)
PROTEIN: NEGATIVE mg/dL
Specific Gravity, Urine: 1.019 (ref 1.005–1.030)

## 2018-01-27 LAB — COMPREHENSIVE METABOLIC PANEL
ALT: 14 U/L (ref 0–44)
AST: 16 U/L (ref 15–41)
Albumin: 3.4 g/dL — ABNORMAL LOW (ref 3.5–5.0)
Alkaline Phosphatase: 61 U/L (ref 38–126)
Anion gap: 10 (ref 5–15)
BUN: 12 mg/dL (ref 6–20)
CHLORIDE: 100 mmol/L (ref 98–111)
CO2: 28 mmol/L (ref 22–32)
Calcium: 9 mg/dL (ref 8.9–10.3)
Creatinine, Ser: 1.26 mg/dL — ABNORMAL HIGH (ref 0.61–1.24)
GFR calc Af Amer: >60 mL/min (ref >60)
GFR calc non Af Amer: >60 mL/min (ref >60)
Glucose, Bld: 108 mg/dL — ABNORMAL HIGH (ref 70–99)
POTASSIUM: 3.6 mmol/L (ref 3.5–5.1)
SODIUM: 138 mmol/L (ref 135–145)
Total Bilirubin: 0.7 mg/dL (ref 0.3–1.2)
Total Protein: 6.6 g/dL (ref 6.5–8.1)

## 2018-01-27 LAB — I-STAT CG4 LACTIC ACID, ED: Lactic Acid, Venous: 0.98 mmol/L (ref 0.5–1.9)

## 2018-01-27 MED ORDER — ENOXAPARIN SODIUM 40 MG/0.4ML ~~LOC~~ SOLN
40.0000 mg | SUBCUTANEOUS | Status: DC
  Administered 2018-01-27 – 2018-01-29 (×3): 40 mg via SUBCUTANEOUS
  Filled 2018-01-27 (×3): qty 0.4

## 2018-01-27 MED ORDER — AZITHROMYCIN 250 MG PO TABS: 1000.0000 mg | ORAL_TABLET | Freq: Once | ORAL | Status: DC

## 2018-01-27 MED ORDER — ONDANSETRON HCL 4 MG PO TABS: 4.0000 mg | ORAL_TABLET | Freq: Four times a day (QID) | ORAL | Status: DC | PRN

## 2018-01-27 MED ORDER — LEVOFLOXACIN IN D5W 750 MG/150ML IV SOLN
750.0000 mg | Freq: Once | INTRAVENOUS | Status: DC
Start: 2018-01-27 — End: 2018-01-27

## 2018-01-27 MED ORDER — PANTOPRAZOLE SODIUM 40 MG PO TBEC
40.0000 mg | DELAYED_RELEASE_TABLET | Freq: Every day | ORAL | Status: DC
  Administered 2018-01-27 – 2018-01-30 (×4): 40 mg via ORAL
  Filled 2018-01-27 (×4): qty 1

## 2018-01-27 MED ORDER — PIPERACILLIN-TAZOBACTAM 3.375 G IVPB
3.3750 g | Freq: Three times a day (TID) | INTRAVENOUS | Status: DC
  Administered 2018-01-27 – 2018-01-29 (×6): 3.375 g via INTRAVENOUS
  Filled 2018-01-27 (×7): qty 50

## 2018-01-27 MED ORDER — PIPERACILLIN-TAZOBACTAM 3.375 G IVPB 30 MIN
3.3750 g | Freq: Once | INTRAVENOUS | Status: AC
  Administered 2018-01-27: 3.375 g via INTRAVENOUS
  Filled 2018-01-27: qty 50

## 2018-01-27 MED ORDER — CEFTRIAXONE SODIUM 250 MG IJ SOLR: 250.0000 mg | Freq: Once | INTRAMUSCULAR | Status: DC

## 2018-01-27 MED ORDER — POTASSIUM CHLORIDE IN NACL 20-0.9 MEQ/L-% IV SOLN
INTRAVENOUS | Status: DC
  Administered 2018-01-27 – 2018-01-29 (×5): via INTRAVENOUS
  Filled 2018-01-27 (×5): qty 1000

## 2018-01-27 MED ORDER — ONDANSETRON HCL 4 MG/2ML IJ SOLN: 4.0000 mg | Freq: Four times a day (QID) | INTRAMUSCULAR | Status: DC | PRN

## 2018-01-27 MED ORDER — KETOROLAC TROMETHAMINE 30 MG/ML IJ SOLN: 30.0000 mg | Freq: Four times a day (QID) | INTRAMUSCULAR | Status: DC | PRN

## 2018-01-27 MED ORDER — OXYCODONE-ACETAMINOPHEN 5-325 MG PO TABS
1.0000 | ORAL_TABLET | Freq: Once | ORAL | Status: AC
  Administered 2018-01-27: 1 via ORAL
  Filled 2018-01-27: qty 1

## 2018-01-27 MED ORDER — OXYCODONE-ACETAMINOPHEN 5-325 MG PO TABS: 1.0000 | ORAL_TABLET | ORAL | Status: DC | PRN

## 2018-01-27 MED ORDER — KETOROLAC TROMETHAMINE 30 MG/ML IJ SOLN
30.0000 mg | Freq: Four times a day (QID) | INTRAMUSCULAR | Status: AC
  Administered 2018-01-27 – 2018-01-29 (×8): 30 mg via INTRAVENOUS
  Filled 2018-01-27 (×8): qty 1

## 2018-01-27 NOTE — Telephone Encounter (Signed)
Post ED Visit - Positive Culture Follow-up  Culture report reviewed by antimicrobial stewardship pharmacist:  []  Elenor Quinones, Pharm.D. []  Heide Guile, Pharm.D., BCPS AQ-ID [x]  Parks Neptune, Pharm.D., BCPS []  Alycia Rossetti, Pharm.D., BCPS []  [place], Pharm.D., BCPS, AAHIVP []  Legrand Como, Pharm.D., BCPS, AAHIVP []  Salome Arnt, PharmD, BCPS []  Johnnette Gourd, PharmD, BCPS []  Hughes Better, PharmD, BCPS []  Leeroy Cha, PharmD  Positive urine culture Treated with Levofloxacin, organism sensitive to the same and no further patient follow-up is required at this time.  Genia Del 01/27/2018, 9:35 AM

## 2018-01-27 NOTE — Progress Notes (Signed)
Pharmacy Antibiotic Note  Randell Teare is a 42 y.o. male admitted on 01/26/2018 with worsening epididymitis.  Pharmacy has been consulted for Zosyn dosing. Zosyn IV given in the ED this AM. WBC and Lactate are wnl.  Plan: Continue Zosyn 3.375g IV every 8 hours - 4 hour infusion.  Monitor renal function, culture results, and clinical status.  Height: 6\' 3"  (190.5 cm) Weight: 180 lb (81.6 kg) IBW/kg (Calculated) : 84.5  Temp (24hrs), Avg:98.5 F (36.9 C), Min:97.6 F (36.4 C), Max:100.1 F (37.8 C)  Recent Labs  Lab 01/27/18 0247 01/27/18 0300  WBC 7.1  --   CREATININE 1.26*  --   LATICACIDVEN  --  0.98    Estimated Creatinine Clearance: 90.8 mL/min (A) (by C-G formula based on SCr of 1.26 mg/dL (H)).    Allergies  Allergen Reactions  . Phenobarbital Hives  . Fish Allergy Swelling and Other (See Comments)    Tongue swelling, lethargy    Antimicrobials this admission: Zosyn 7/7 >>  Dose adjustments this admission:   Microbiology results: 7/7 Blood >> 7/7 Urine >>  Thank you for allowing pharmacy to be a part of this patient's care.  Sloan Leiter, PharmD, BCPS, BCCCP Clinical Pharmacist Clinical phone 01/27/2018 until 3:30PM [phone] After hours, please call [phone] 01/27/2018 12:54 PM

## 2018-01-27 NOTE — Progress Notes (Addendum)
Seen examined No new issue tol diet Pain controlled Self-caths since 2016-had ecoli urosepsis last year--uses sterile technique Independent-works as electrician  P1-2 days abx iv--narrow if in 24 hours no new culture data [cult in Ed 7/4 grew MSSA] OP follow-up Dr. Wyatt Mage need Urology input unless clinical and physiologic worsening   Verneita Griffes, MD [facility] (P) [phone]

## 2018-01-27 NOTE — ED Provider Notes (Signed)
[facility] EMERGENCY DEPARTMENT Provider Note   CSN: [id number] Arrival date & time: 01/26/18  2104     History   Chief Complaint Chief Complaint  Patient presents with  . Testicle Pain    HPI Andrew Higgins is a 42 y.o. male.  Patient returns to the ED with testicular pain and scrotal swelling that started 3 days ago. He was seen at the time of onset and diagnosed with right epididymitis and started on Levaquin. He returns because the symptoms of pain and swelling have worsened. He denies fever. No nausea or vomiting. He has a history of spinal tumor, previously removed, leaving him with a neurogenic bladder. He self caths at home and reports no difficulty passing the catheter, no hematuria.   The history is provided by the patient and a parent. No language interpreter was used.  Testicle Pain  Pertinent negatives include no abdominal pain.    Past Medical History:  Diagnosis Date  . 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 Hard of hearing   . TBI (traumatic brain injury) [facility])    age 7- steel chair, seizures-- dilantin    Patient Active Problem List   Diagnosis Date Noted  . Pyelonephritis 11/05/2016  . ARF (acute renal failure) ([place]) 11/05/2016  . Thrombocytopenia ([place]) 11/05/2016  . 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 Hydronephrosis 10/21/2014  . Urinary retention 10/21/2014  . Sepsis ([place]) 10/21/2014  . Renal abscess     Past Surgical History:  Procedure Laterality Date  . TUMOR REMOVAL  2016   SPINAL TUMOR-PARTIALLY REMOVED        Home Medications    Prior to Admission medications   Medication Sig Start Date End Date Taking? Authorizing Provider  acetaminophen (TYLENOL) 500 MG tablet Take 500 mg by mouth every 6 (six) hours as needed for moderate pain.    [provider]  levofloxacin (LEVAQUIN) 500 MG tablet Take 1 tablet (500 mg total) by mouth daily. 01/24/18   Hedges, Dellis Filbert, PA-C  Multiple Vitamin  (MULTIVITAMIN WITH MINERALS) TABS tablet Take 1 tablet by mouth daily.    [provider]  traMADol (ULTRAM) 50 MG tablet Take 1 tablet (50 mg total) by mouth every 6 (six) hours as needed. 01/24/18   Okey Regal, PA-C    Family History Family History  Problem Relation Age of Onset  . CAD Father   . Diabetes Mellitus II Neg Hx     Social History Social History   Tobacco Use  . Smoking status: Never Smoker  . Smokeless tobacco: Never Used  Substance Use Topics  . Alcohol use: No  . Drug use: No     Allergies   Phenobarbital and Fish allergy   Review of Systems Review of Systems  Constitutional: Negative for chills and fever.  Gastrointestinal: Negative.  Negative for abdominal pain, nausea and vomiting.  Genitourinary: Positive for scrotal swelling and testicular pain. Negative for hematuria.  Musculoskeletal: Negative.  Negative for back pain.  Skin: Negative.   Neurological: Negative.      Physical Exam Updated Vital Signs BP 139/71 (BP Location: Right Arm)   Pulse 88   Temp 100.1 F (37.8 C) (Oral)   Resp 20   SpO2 99%   Physical Exam  Constitutional: He is oriented to person, place, and time. He appears well-developed and well-nourished.  Neck: Normal range of motion.  Pulmonary/Chest: Effort normal.  Abdominal:  Suprapubic abdominal tenderness that is mild.   Genitourinary:  Genitourinary  Comments: Scrotum is significantly swollen and tender. No discrete abscess, blistering or wound.  Musculoskeletal: Normal range of motion.  Lymphadenopathy:       Right: No inguinal adenopathy present.       Left: No inguinal adenopathy present.  Neurological: He is alert and oriented to person, place, and time.  Skin: Skin is warm and dry.  Psychiatric: He has a normal mood and affect.     ED Treatments / Results  Labs (all labs ordered are listed, but only abnormal results are displayed) Labs Reviewed  CULTURE, BLOOD (ROUTINE X 2)  CULTURE, BLOOD  (ROUTINE X 2)  URINE CULTURE  CBC WITH DIFFERENTIAL/PLATELET  COMPREHENSIVE METABOLIC PANEL  URINALYSIS, ROUTINE W REFLEX MICROSCOPIC  I-STAT CG4 LACTIC ACID, ED    EKG None  Radiology No results found.  Procedures Procedures (including critical care time)  Medications Ordered in ED Medications  oxyCODONE-acetaminophen (PERCOCET/ROXICET) 5-325 MG per tablet 1 tablet (has no administration in time range)     Initial Impression / Assessment and Plan / ED Course  I have reviewed the triage vital signs and the nursing notes.  Pertinent labs & imaging results that were available during my care of the patient were reviewed by me and considered in my medical decision making (see chart for details).     Patient with a recent diagnosis of epididymitis returns with worsening scrotal swelling and pain. Found to have a low grade temperature here.   Labs pending. Will plan to repeat the [place] of scrotum for comparison of epididymis, evaluation for abscess. Pain medication provided.   Repeat US shows persistent epididymitis with interim development of orchitis. Review of urine cultures done 01/24/18 shows >100000 colonies Staph aureus.  Pharmacy consulted. Given positive urine culture will start on Zosyn. Patient will need to be admitted for further management. Patient and mother updated on plan to admit.  Final Clinical Impressions(s) / ED Diagnoses   Final diagnoses:  None   1. Staph aureus UTI 2. Epididymitis and orchitis  ED Discharge Orders    None       Charlann Lange, PA-C 01/27/18 9449    Ezequiel Essex, MD 01/27/18 [phone]

## 2018-01-27 NOTE — Progress Notes (Signed)
Patient admitted from ED with testicular pain. Alert and oriented x4. Vital signs stable. Skin intact. Very hard of hearing. Denies pain. Paged admitting doctor for orders.

## 2018-01-27 NOTE — H&P (Signed)
History and Physical    Andrew Higgins MWN:[id number] DOB: 12/29/1975 DOA: 01/26/2018  PCP: Carlos Levering, PA-C   Patient coming from: Home.  I have personally briefly reviewed patient's old medical records in [place]  Chief Complaint: Worsening epididymitis.  HPI: Andrew Higgins is a 42 y.o. 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  He self catheterizes often.  He is in a monogamous relationship.  He denies he denies fever, chills, sore throat, dyspnea, chest pain, abdominal pain, diarrhea, constipation, melena or hematochezia.  Denies dysuria or gross hematuria.  No polyuria, polydipsia or blurred vision.  No pruritus or skin rashes.  ED Course: Initial vital signs temperature 100.1 F, pulse 88, respirations 20, blood pressure 139/71 mmHg and O2 sat 99% on room air.  He received a tablet of Percocet and the first dose of Zosyn in the emergency department.  Cultures x2 were drawn.  Actiq acid was normal.  His urinalysis shows small hemoglobinuria and small leukocyte esterase.  Urine culture was sent to the lab.  UC&S from 01/24/2018 was positive for methicillin sensitive staph hours.  CBC showed a white count was 7.1 with 75% neutrophils, 13% lymphocytes and 10% monocytes.  Hemoglobin 11.8 g/dL and platelets 128.  CMP shows a glucose of 180 and creatinine of 1.26 mg/dL.  Albumin was 3.4 g/dL.  All other values were normal.    Scrotal ultrasound shows persistent findings consistent with acute right-sided epididymitis, with interval development of associated right-sided orchitis.  There is interval development of complex and septated right-sided hydrocele it.  No frank abscess.  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  Review of Systems: As per HPI otherwise 10 point review of systems  negative.   Past Medical History:  Diagnosis Date  . 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 Hard of hearing   . TBI (traumatic brain injury) [facility])    age 77- steel chair, seizures-- dilantin    Past Surgical History:  Procedure Laterality Date  . TUMOR REMOVAL  2016   SPINAL TUMOR-PARTIALLY REMOVED     reports that he has never smoked. He has never used smokeless tobacco. He reports that he does not drink alcohol or use drugs.  Allergies  Allergen Reactions  . Phenobarbital Hives  . Fish Allergy Swelling and Other (See Comments)    Tongue swelling, lethargy    Family History  Problem Relation Age of Onset  . CAD Father   . Diabetes Mellitus II Neg Hx     Prior to Admission medications   Medication Sig Start Date End Date Taking? Authorizing Provider  acetaminophen (TYLENOL) 500 MG tablet Take 500 mg by mouth every 6 (six) hours as needed for moderate pain.   Yes [provider]  levofloxacin (LEVAQUIN) 500 MG tablet Take 1 tablet (500 mg total) by mouth daily. 01/24/18  Yes Hedges, Dellis Filbert, PA-C  traMADol (ULTRAM) 50 MG tablet Take 1 tablet (50 mg total) by mouth every 6 (six) hours as needed. Patient taking differently: Take 50 mg by mouth every 6 (six) hours as needed for moderate pain.  01/24/18  Yes Okey Regal, PA-C    Physical Exam: Vitals:   01/26/18 2109 01/27/18 0130  BP: 139/71 132/78  Pulse: 88 78  Resp: 20   Temp: 100.1 F (37.8 C)   TempSrc: Oral  SpO2: 99% 100%    Constitutional: NAD, calm, comfortable Eyes: PERRL, lids and conjunctivae normal ENMT: Mucous membranes are moist. Posterior pharynx clear of any exudate or lesions.Normal dentition.  Neck: normal, supple, no masses, no thyromegaly Respiratory: clear to auscultation bilaterally, no wheezing, no crackles. Normal respiratory effort. No accessory muscle use.  Cardiovascular: Regular rate and rhythm, no murmurs / rubs / gallops. No extremity edema. 2+ pedal pulses. No carotid bruits.    Abdomen: no tenderness, no masses palpated. No hepatosplenomegaly. Bowel sounds positive.   Genitalia:There is significant edema, erythema, calor and tenderness to palpation on right scrotal area.  Please see picture below. Musculoskeletal: no clubbing / cyanosis. No joint deformity upper and lower extremities. Good ROM, no contractures. Normal muscle tone.  Skin: no rashes, lesions, ulcers. No induration Neurologic: CN 2-12 grossly intact. Sensation intact, DTR normal. Strength 5/5 in all 4.  Psychiatric: Normal judgment and insight. Alert and oriented x 3. Normal mood.       Labs on Admission: I have personally reviewed following labs and imaging studies  CBC: Recent Labs  Lab 01/27/18 0247  WBC 7.1  NEUTROABS 5.2  HGB 11.8*  HCT 36.7*  MCV 104.9*  PLT 712*   Basic Metabolic Panel: Recent Labs  Lab 01/27/18 0247  NA 138  K 3.6  CL 100  CO2 28  GLUCOSE 108*  BUN 12  CREATININE 1.26*  CALCIUM 9.0   GFR: CrCl cannot be calculated (Unknown ideal weight.). Liver Function Tests: Recent Labs  Lab 01/27/18 0247  AST 16  ALT 14  ALKPHOS 61  BILITOT 0.7  PROT 6.6  ALBUMIN 3.4*   No results for input(s): LIPASE, AMYLASE in the last 168 hours. No results for input(s): AMMONIA in the last 168 hours. Coagulation Profile: No results for input(s): INR, PROTIME in the last 168 hours. Cardiac Enzymes: No results for input(s): CKTOTAL, CKMB, CKMBINDEX, TROPONINI in the last 168 hours. BNP (last 3 results) No results for input(s): PROBNP in the last 8760 hours. HbA1C: No results for input(s): HGBA1C in the last 72 hours. CBG: No results for input(s): GLUCAP in the last 168 hours. Lipid Profile: No results for input(s): CHOL, HDL, LDLCALC, TRIG, CHOLHDL, LDLDIRECT in the last 72 hours. Thyroid Function Tests: No results for input(s): TSH, T4TOTAL, FREET4, T3FREE, THYROIDAB in the last 72 hours. Anemia Panel: No results for input(s): VITAMINB12, FOLATE, FERRITIN,  TIBC, IRON, RETICCTPCT in the last 72 hours. Urine analysis:    Component Value Date/Time   COLORURINE YELLOW 01/24/2018 1243   APPEARANCEUR CLOUDY (A) 01/24/2018 1243   LABSPEC 1.015 01/24/2018 1243   PHURINE 7.0 01/24/2018 1243   GLUCOSEU NEGATIVE 01/24/2018 1243   HGBUR TRACE (A) 01/24/2018 1243   BILIRUBINUR NEGATIVE 01/24/2018 1243   KETONESUR NEGATIVE 01/24/2018 1243   PROTEINUR 30 (A) 01/24/2018 1243   UROBILINOGEN 1.0 10/21/2014 1941   NITRITE POSITIVE (A) 01/24/2018 1243   LEUKOCYTESUR MODERATE (A) 01/24/2018 1243    Radiological Exams on Admission: US Scrotum W/doppler  Result Date: 01/27/2018 CLINICAL DATA:  Initial evaluation for acute right testicular pain. EXAM: SCROTAL ULTRASOUND DOPPLER ULTRASOUND OF THE TESTICLES TECHNIQUE: Complete ultrasound examination of the testicles, epididymis, and other scrotal structures was performed. Color and spectral Doppler ultrasound were also utilized to evaluate blood flow to the testicles. COMPARISON:  Prior ultrasound from 01/24/2018. FINDINGS: Right testicle Measurements: 3.8 x 2.8 x 2.8 cm. No mass or microlithiasis visualized. Right testicle not demonstrates a somewhat heterogeneous echotexture with increased vascularity as compared to  the left. Left testicle Measurements: 3.9 x 1.9 x 2.2 cm. No mass lesion. Few scattered microcalcifications noted, similar to previous. Right epididymis: Right epididymis head heterogeneous and hyperemic, consistent with epididymitis. 8 mm right epididymal head cyst again noted. Left epididymis:  Normal in size and appearance. Hydrocele: Interval development of complex right-sided hydrocele with scattered internal septations. No frank abscess. Varicocele:  None visualized. Pulsed Doppler interrogation of both testes demonstrates normal low resistance arterial and venous waveforms bilaterally. IMPRESSION: 1. Persistent findings consistent with acute right-sided epididymitis, with interval development of  associated right-sided orchitis. 2. Interval development of complex and septated right-sided hydrocele. No frank abscess. 3. Mild left testicular microlithiasis. Electronically Signed   By: Jeannine Boga M.D.   On: 01/27/2018 02:35    EKG: Independently reviewed.   Assessment/Plan Principal Problem:   Right epididymitis Admit to MedSurg/inpatient. Continue Zosyn. Analgesics as needed. Consult urology later today.  Active Problems:   Thrombocytopenia (HCC) Monitor platelets.    Anemia Anemia panel in April 2018 show increased ferritin with decreased iron and TIBC. W86 and folic acid level at that time were normal. Monitor hematocrit and hemoglobin.    DVT prophylaxis: Lovenox SQ. Code Status: Full code. Family Communication:  Disposition Plan: Admit for IV antibiotics for 2 to 3 days.  Symptoms management. Consults called:  Admission status: Inpatient/MedSurg.   Reubin Milan MD [facility] Pager [phone].  If 7PM-7AM, please contact night-coverage [email or web address] Password [facility]  01/27/2018, 4:42 AM

## 2018-01-28 ENCOUNTER — Other Ambulatory Visit: Payer: Self-pay

## 2018-01-28 LAB — BASIC METABOLIC PANEL
ANION GAP: 6 (ref 5–15)
BUN: 14 mg/dL (ref 6–20)
CO2: 28 mmol/L (ref 22–32)
CREATININE: 1.26 mg/dL — AB (ref 0.61–1.24)
Calcium: 8.7 mg/dL — ABNORMAL LOW (ref 8.9–10.3)
Chloride: 108 mmol/L (ref 98–111)
Glucose, Bld: 102 mg/dL — ABNORMAL HIGH (ref 70–99)
Potassium: 4.1 mmol/L (ref 3.5–5.1)
SODIUM: 142 mmol/L (ref 135–145)

## 2018-01-28 LAB — CBC WITH DIFFERENTIAL/PLATELET
Abs Immature Granulocytes: 0 10*3/uL (ref 0.0–0.1)
BASOS PCT: 1 %
Basophils Absolute: 0 10*3/uL (ref 0.0–0.1)
EOS ABS: 0.2 10*3/uL (ref 0.0–0.7)
Eosinophils Relative: 4 %
HCT: 38 % — ABNORMAL LOW (ref 39.0–52.0)
Hemoglobin: 12.3 g/dL — ABNORMAL LOW (ref 13.0–17.0)
IMMATURE GRANULOCYTES: 0 %
Lymphocytes Relative: 18 %
Lymphs Abs: 0.8 10*3/uL (ref 0.7–4.0)
MCH: 33.5 pg (ref 26.0–34.0)
MCHC: 32.4 g/dL (ref 30.0–36.0)
MCV: 103.5 fL — AB (ref 78.0–100.0)
MONO ABS: 0.5 10*3/uL (ref 0.1–1.0)
MONOS PCT: 10 %
NEUTROS PCT: 67 %
Neutro Abs: 3.2 10*3/uL (ref 1.7–7.7)
Platelets: 128 10*3/uL — ABNORMAL LOW (ref 150–400)
RBC: 3.67 MIL/uL — ABNORMAL LOW (ref 4.22–5.81)
RDW: 11.4 % — ABNORMAL LOW (ref 11.5–15.5)
WBC: 4.7 10*3/uL (ref 4.0–10.5)

## 2018-01-28 LAB — URINE CULTURE: Culture: NO GROWTH

## 2018-01-28 LAB — HIV ANTIBODY (ROUTINE TESTING W REFLEX): HIV Screen 4th Generation wRfx: NONREACTIVE

## 2018-01-28 NOTE — Progress Notes (Signed)
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 Initially presented 01/24/2018 to emergency room with right testicular pain without penile discharge Treated empirically in the emergency room gonorrhea chlamydia and given Levaquin for treatment Return to emergency room 7/7 with failed outpatient treatment on Levaquin started empirically on Zosyn   A & Plan Epididymitis-significant improvement however cultures are still pending and given the fact that patient failed outpatient management would not narrow-he is growing MSSA so it is feasible in the next 24 hours with culture data to transition to either penicillin or first/second generation cephalosporin orally to complete 10 days  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  DVT prophylaxis: Lovenox code Status: Full family Communication: No family present disposition Plan: Inpatient pending resolution and culture data not ready for discharge   Verlon Au, MD  [facility] Direct contact: [phone] --Via amion app OR  --[email or web address]; password [email or web address]  7PM-7AM contact night coverage as above 01/28/2018, 9:21 AM  LOS: 1 day   Consultants:  None as yet  Procedures:  None  Antimicrobials:  IV Zosyn  Interval history/Subjective: Feels much better Swelling in groin is completely resolved per him he still has some slight dragging sensation Multiple questions answered about how to prevent this-his urologist is Dr. Tammi Klippel  Objective:  Vitals:  Vitals:   01/27/18 2109 01/28/18  0418  BP: 112/72 123/87  Pulse: 68 (!) 57  Resp: 17 17  Temp: 98.3 F (36.8 C) 97.6 F (36.4 C)  SpO2: 99% 100%    Exam:  . Awake alert pleasant no distress . EOMI NCAT . Very hard of hearing . Chest is clear . S1-S2 no murmur not on telemetry . Abdomen soft . Testicular exam-essentially no swelling of the right side of his scrotal sac and do not appreciate any erythema in the surrounding skin-further exam deferred   I have personally reviewed the following:   Labs:  Glucose 102  WBC 4.7  Imaging studies:  None  Medical tests:  None  Test discussed with performing physician:  No  Decision to obtain old records:  No  Review and summation of old records:  Yes summarized extensively  Scheduled Meds: . enoxaparin (LOVENOX) injection  40 mg Subcutaneous Q24H  . ketorolac  30 mg Intravenous Q6H  . pantoprazole  40 mg Oral Daily   Continuous Infusions: . 0.9 % NaCl with KCl 20 mEq / L 88 mL/hr at 01/27/18 1949  . piperacillin-tazobactam (ZOSYN)  IV 3.375 g (01/28/18 0602)    Principal Problem:   Right epididymitis Active Problems:   Thrombocytopenia ([place])   Anemia   LOS: 1 day

## 2018-01-29 LAB — CBC WITH DIFFERENTIAL/PLATELET
ABS IMMATURE GRANULOCYTES: 0 10*3/uL (ref 0.0–0.1)
Basophils Absolute: 0 10*3/uL (ref 0.0–0.1)
Basophils Relative: 0 %
Eosinophils Absolute: 0.2 10*3/uL (ref 0.0–0.7)
Eosinophils Relative: 4 %
HCT: 35.4 % — ABNORMAL LOW (ref 39.0–52.0)
HEMOGLOBIN: 11.6 g/dL — AB (ref 13.0–17.0)
IMMATURE GRANULOCYTES: 1 %
LYMPHS PCT: 17 %
Lymphs Abs: 0.9 10*3/uL (ref 0.7–4.0)
MCH: 33.6 pg (ref 26.0–34.0)
MCHC: 32.8 g/dL (ref 30.0–36.0)
MCV: 102.6 fL — AB (ref 78.0–100.0)
MONO ABS: 0.6 10*3/uL (ref 0.1–1.0)
MONOS PCT: 12 %
NEUTROS ABS: 3.7 10*3/uL (ref 1.7–7.7)
NEUTROS PCT: 66 %
Platelets: 130 10*3/uL — ABNORMAL LOW (ref 150–400)
RBC: 3.45 MIL/uL — ABNORMAL LOW (ref 4.22–5.81)
RDW: 11.1 % — ABNORMAL LOW (ref 11.5–15.5)
WBC: 5.5 10*3/uL (ref 4.0–10.5)

## 2018-01-29 MED ORDER — LEVOFLOXACIN 500 MG PO TABS
500.0000 mg | ORAL_TABLET | Freq: Every day | ORAL | Status: DC
  Administered 2018-01-29 – 2018-01-30 (×2): 500 mg via ORAL
  Filled 2018-01-29 (×2): qty 1

## 2018-01-29 NOTE — Progress Notes (Signed)
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 Initially presented 01/24/2018 to emergency room with right testicular pain without penile discharge Treated empirically in the emergency room gonorrhea chlamydia and given Levaquin for treatment Return to emergency room 7/7 with failed outpatient treatment on Levaquin started empirically on Zosyn   A & Plan Epididymitis-significant improvement however cultures are still pending and given the fact that patient failed outpatient management would not narrow-he is growing MSSA 7/4 Repeat cultures here have no growth probably because of sterilization Transitioning back to Levaquin for ease of completion--- if afebrile and no further swelling or pain can discharge as early as 7/10 to complete therapy outpatient  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 age 85-stable  DVT prophylaxis: Lovenox code Status: Full family Communication: No family present disposition Plan: Inpatient plan for discharge 7/10    Darby Fleeman, MD  [facility] Direct contact: [phone] --Via amion app OR  --[email or web address]; password [email or web address]  7PM-7AM contact night coverage as above 01/29/2018, 1:20 PM  LOS: 2 days   Consultants:  None as yet  Procedures:  None  Antimicrobials:  IV Zosyn stopped on 7/9  Resume Levaquin 7/9  Interval history/Subjective:  Unit with no issue pain is somewhat better Asking if can go home Long discussion about rationale to stay 1 more day-understands  Objective:  Vitals:  Vitals:   01/28/18  2149 01/29/18 0532  BP: 120/85 132/83  Pulse: 67 67  Resp: 19 18  Temp: 98.1 F (36.7 C) 97.7 F (36.5 C)  SpO2: 99% 100%    Exam:  . Awake alert pleasant no distress . EOMI NCAT . Very hard of hearing . Chest is clear . S1-S2 no murmur not on telemetry . Abdomen soft Testicular exam-deferred today I have personally reviewed the following:   Labs:  Hypok resolved on 7/8  Imaging studies:  None  Medical tests:  None  Test discussed with performing physician:  No  Decision to obtain old records:  No  Review and summation of old records:  Yes summarized extensively  Scheduled Meds: . enoxaparin (LOVENOX) injection  40 mg Subcutaneous Q24H  . pantoprazole  40 mg Oral Daily   Continuous Infusions: . 0.9 % NaCl with KCl 20 mEq / L 88 mL/hr at 01/29/18 0548  . piperacillin-tazobactam (ZOSYN)  IV 3.375 g (01/29/18 0550)    Principal Problem:   Right epididymitis Active Problems:   Thrombocytopenia ([place])   Anemia   LOS: 2 days

## 2018-01-30 DIAGNOSIS — N451 Epididymitis: Secondary | ICD-10-CM

## 2018-01-30 LAB — CBC WITH DIFFERENTIAL/PLATELET
ABS IMMATURE GRANULOCYTES: 0.1 10*3/uL (ref 0.0–0.1)
BASOS PCT: 0 %
Basophils Absolute: 0 10*3/uL (ref 0.0–0.1)
EOS ABS: 0.3 10*3/uL (ref 0.0–0.7)
Eosinophils Relative: 4 %
HCT: 36.4 % — ABNORMAL LOW (ref 39.0–52.0)
Hemoglobin: 11.8 g/dL — ABNORMAL LOW (ref 13.0–17.0)
Immature Granulocytes: 1 %
Lymphocytes Relative: 17 %
Lymphs Abs: 1.2 10*3/uL (ref 0.7–4.0)
MCH: 33.2 pg (ref 26.0–34.0)
MCHC: 32.4 g/dL (ref 30.0–36.0)
MCV: 102.5 fL — ABNORMAL HIGH (ref 78.0–100.0)
MONOS PCT: 10 %
Monocytes Absolute: 0.7 10*3/uL (ref 0.1–1.0)
NEUTROS ABS: 4.7 10*3/uL (ref 1.7–7.7)
Neutrophils Relative %: 68 %
PLATELETS: 140 10*3/uL — AB (ref 150–400)
RBC: 3.55 MIL/uL — ABNORMAL LOW (ref 4.22–5.81)
RDW: 11.3 % — AB (ref 11.5–15.5)
WBC: 6.9 10*3/uL (ref 4.0–10.5)

## 2018-01-30 LAB — BASIC METABOLIC PANEL
ANION GAP: 9 (ref 5–15)
BUN: 11 mg/dL (ref 6–20)
CALCIUM: 8.8 mg/dL — AB (ref 8.9–10.3)
CO2: 27 mmol/L (ref 22–32)
Chloride: 105 mmol/L (ref 98–111)
Creatinine, Ser: 1.23 mg/dL (ref 0.61–1.24)
GFR calc Af Amer: >60 mL/min (ref >60)
Glucose, Bld: 106 mg/dL — ABNORMAL HIGH (ref 70–99)
POTASSIUM: 3.7 mmol/L (ref 3.5–5.1)
SODIUM: 141 mmol/L (ref 135–145)

## 2018-01-30 MED ORDER — CEPHALEXIN 500 MG PO CAPS: 500.0000 mg | ORAL_CAPSULE | Freq: Three times a day (TID) | ORAL | 0 refills | Status: DC

## 2018-01-30 MED ORDER — CEPHALEXIN 250 MG PO CAPS
250.0000 mg | ORAL_CAPSULE | Freq: Three times a day (TID) | ORAL | Status: DC
  Administered 2018-01-30: 250 mg via ORAL
  Filled 2018-01-30 (×2): qty 1

## 2018-01-30 NOTE — Progress Notes (Signed)
Patient discharged to home with instructions and prescription. 

## 2018-02-01 LAB — CULTURE, BLOOD (ROUTINE X 2)
CULTURE: NO GROWTH
Culture: NO GROWTH
SPECIAL REQUESTS: ADEQUATE
Special Requests: ADEQUATE

## 2018-02-01 NOTE — Discharge Summary (Signed)
Physician Discharge Summary  Andrew Higgins [id number] DOB: 06-24-1976 DOA: 01/26/2018  PCP: London Pepper, MD  Admit date: 01/26/2018 Discharge date: 01/30/2018  Time spent: 35 minutes  Recommendations for Outpatient Follow-up:  Urologist at [facility] in 10 days PCP in 1 week  Discharge Diagnoses:  Principal Problem:   Right epididymitis   Neurogenic bladder   Thrombocytopenia ([place])   Anemia   Discharge Condition: stable  Diet recommendation: regular  Filed Weights   01/27/18 0600  Weight: 81.6 kg (180 lb)    History of present illness:  Andrew Higgins is a 42 y.o. 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 being followed at [facility] with urinary retention and patient uses in and out catheter for urination and presents with right testicular pain  Hospital Course:   Epididymitis- -admitted with testicular swelling pain, redness -Ultrasound noted epididymitis and some early orchitis, no abscess -Urine culture crew MSSA from 7/4, repeat cultures now are negative -Improved significantly with broad-spectrum IV antibiotics, based on previous cultures transitioned to oral Keflex at discharge to complete antibiotic course -Advised follow-up with his urologist at [facility] in 1-2 weeks  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 Tommi Rumps of neurosurgery 01/05/2015 -advised caution and to use sterile techniques during self catheterization  Discharge Exam: Vitals:   01/29/18 2114 01/30/18 0513  BP: (!) 146/91 140/88  Pulse: 70 75  Resp: 18 17  Temp: 98.9 F (37.2 C) 99.1 F (37.3 C)  SpO2: 100% 99%    General: AAOx3 Cardiovascular: SS2/RRR Respiratory: CTAB  Discharge Instructions   Discharge Instructions    Diet - low sodium heart healthy   Complete by:  As directed    Increase activity slowly   Complete by:  As directed      Allergies as of 01/30/2018      Reactions   Phenobarbital Hives   Fish Allergy Swelling, Other (See Comments)   Tongue swelling, lethargy      Medication List    STOP taking these medications   levofloxacin 500 MG tablet Commonly known as:  LEVAQUIN     TAKE these medications   acetaminophen 500 MG tablet Commonly known as:  TYLENOL Take 500 mg by mouth every 6 (six) hours as needed for moderate pain.   cephALEXin 500 MG capsule Commonly known as:  KEFLEX Take 1 capsule (500 mg total) by mouth 3 (three) times daily. For 3days   traMADol 50 MG tablet Commonly known as:  ULTRAM Take 1 tablet (50 mg total) by mouth every 6 (six) hours as needed. What changed:  reasons to take this      Allergies  Allergen Reactions  . Phenobarbital Hives  . Fish Allergy Swelling and Other (See Comments)    Tongue swelling, lethargy   Follow-up Information    London Pepper, MD. Schedule an appointment as soon as possible for a visit in 1 week(s).   Specialty:  Family Medicine Contact information: [address] [place] [address] [phone]            The results of significant diagnostics from this hospitalization (including imaging, microbiology, ancillary and laboratory) are listed below for reference.    Significant Diagnostic Studies: US Scrotum W/doppler  Result Date: 01/27/2018 CLINICAL DATA:  Initial evaluation for acute right testicular pain. EXAM: SCROTAL ULTRASOUND DOPPLER ULTRASOUND OF THE TESTICLES TECHNIQUE:  Complete ultrasound examination of the testicles, epididymis, and other scrotal structures was performed. Color and spectral Doppler ultrasound were also utilized to evaluate blood flow to the testicles. COMPARISON:  Prior ultrasound from 01/24/2018. FINDINGS: Right testicle Measurements: 3.8 x 2.8 x 2.8 cm. No mass or microlithiasis visualized. Right testicle not demonstrates a  somewhat heterogeneous echotexture with increased vascularity as compared to the left. Left testicle Measurements: 3.9 x 1.9 x 2.2 cm. No mass lesion. Few scattered microcalcifications noted, similar to previous. Right epididymis: Right epididymis head heterogeneous and hyperemic, consistent with epididymitis. 8 mm right epididymal head cyst again noted. Left epididymis:  Normal in size and appearance. Hydrocele: Interval development of complex right-sided hydrocele with scattered internal septations. No frank abscess. Varicocele:  None visualized. Pulsed Doppler interrogation of both testes demonstrates normal low resistance arterial and venous waveforms bilaterally. IMPRESSION: 1. Persistent findings consistent with acute right-sided epididymitis, with interval development of associated right-sided orchitis. 2. Interval development of complex and septated right-sided hydrocele. No frank abscess. 3. Mild left testicular microlithiasis. Electronically Signed   By: Jeannine Boga M.D.   On: 01/27/2018 02:35   US Scrotum W/doppler  Result Date: 01/24/2018 CLINICAL DATA:  Right scrotal pain and swelling for 1 day. EXAM: SCROTAL ULTRASOUND DOPPLER ULTRASOUND OF THE TESTICLES TECHNIQUE: Complete ultrasound examination of the testicles, epididymis, and other scrotal structures was performed. Color and spectral Doppler ultrasound were also utilized to evaluate blood flow to the testicles. COMPARISON:  CT scan 11/05/2016 FINDINGS: Right testicle Measurements: 4.0 x 1.8 x 2.9 cm. Symmetric and homogeneous echotexture without focal lesion. Patent arterial and venous blood flow. Left testicle Measurements: 3.5 x 1.9 x 2.8 cm. Symmetric and homogeneous echotexture without focal lesion. A few tiny vascular appearing calcifications possibly due to prior inflammation or infection. Patent arterial and venous blood flow. Right epididymis: Enlarged and hypervascular with a 8 mm epididymal cyst. Findings suggest epididymitis.  Left epididymis:  Normal in size and appearance. Hydrocele:  Small right hydrocele Varicocele:  Left-sided varicocele Pulsed Doppler interrogation of both testes demonstrates normal low resistance arterial and venous waveforms bilaterally. IMPRESSION: 1. Ultrasound findings consistent with right-sided epididymitis, likely explaining the patient's right-sided scrotal pain and swelling. 2. Normal appearance of both testicles except for a few possible vascular calcifications on the left. No worrisome testicular lesions. 3. Small right-sided hydrocele. 4. Left-sided varicocele. Electronically Signed   By: Marijo Sanes M.D.   On: 01/24/2018 13:21    Microbiology: Recent Results (from the past 240 hour(s))  Urine culture     Status: Abnormal   Collection Time: 01/24/18  2:20 PM  Result Value Ref Range Status   Specimen Description URINE, CATHETERIZED  Final   Special Requests   Final    NONE Performed at [facility], [address], [place], [place] [address]    Culture >=100,000 COLONIES/mL STAPHYLOCOCCUS AUREUS (A)  Final   Report Status 01/26/2018 FINAL  Final   Organism ID, Bacteria STAPHYLOCOCCUS AUREUS (A)  Final      Susceptibility   Staphylococcus aureus - MIC*    CIPROFLOXACIN <=0.5 SENSITIVE Sensitive     GENTAMICIN <=0.5 SENSITIVE Sensitive     NITROFURANTOIN <=16 SENSITIVE Sensitive     OXACILLIN <=0.25 SENSITIVE Sensitive     TETRACYCLINE <=1 SENSITIVE Sensitive     VANCOMYCIN 1 SENSITIVE Sensitive     TRIMETH/SULFA <=10 SENSITIVE Sensitive     CLINDAMYCIN <=0.25 SENSITIVE Sensitive     RIFAMPIN <=0.5 SENSITIVE Sensitive     Inducible  Clindamycin NEGATIVE Sensitive     * >=100,000 COLONIES/mL STAPHYLOCOCCUS AUREUS  Blood culture (routine x 2)     Status: None   Collection Time: 01/27/18  2:48 AM  Result Value Ref Range Status   Specimen Description BLOOD LEFT ANTECUBITAL  Final   Special Requests   Final    BOTTLES DRAWN AEROBIC AND ANAEROBIC Blood Culture adequate  volume   Culture   Final    NO GROWTH 5 DAYS Performed at [facility], [place] [address], [place], [place] [address]    Report Status 02/01/2018 FINAL  Final  Blood culture (routine x 2)     Status: None   Collection Time: 01/27/18  2:51 AM  Result Value Ref Range Status   Specimen Description BLOOD RIGHT ANTECUBITAL  Final   Special Requests   Final    BOTTLES DRAWN AEROBIC AND ANAEROBIC Blood Culture adequate volume   Culture   Final    NO GROWTH 5 DAYS Performed at [facility], [place] [address] [place], [place] [address]    Report Status 02/01/2018 FINAL  Final  Urine Culture     Status: None   Collection Time: 01/27/18  4:50 AM  Result Value Ref Range Status   Specimen Description URINE, CLEAN CATCH  Final   Special Requests NONE  Final   Culture   Final    NO GROWTH Performed at [facility], [place] [address], [place], [place] [address]    Report Status 01/28/2018 FINAL  Final     Labs: Basic Metabolic Panel: Recent Labs  Lab 01/27/18 0247 01/28/18 0557 01/30/18 0321  NA 138 142 141  K 3.6 4.1 3.7  CL 100 108 105  CO2 28 28 27   GLUCOSE 108* 102* 106*  BUN 12 14 11   CREATININE 1.26* 1.26* 1.23  CALCIUM 9.0 8.7* 8.8*   Liver Function Tests: Recent Labs  Lab 01/27/18 0247  AST 16  ALT 14  ALKPHOS 61  BILITOT 0.7  PROT 6.6  ALBUMIN 3.4*   No results for input(s): LIPASE, AMYLASE in the last 168 hours. No results for input(s): AMMONIA in the last 168 hours. CBC: Recent Labs  Lab 01/27/18 0247 01/28/18 0557 01/29/18 0702 01/30/18 0321  WBC 7.1 4.7 5.5 6.9  NEUTROABS 5.2 3.2 3.7 4.7  HGB 11.8* 12.3* 11.6* 11.8*  HCT 36.7* 38.0* 35.4* 36.4*  MCV 104.9* 103.5* 102.6* 102.5*  PLT 128* 128* 130* 140*   Cardiac Enzymes: No results for input(s): CKTOTAL, CKMB, CKMBINDEX, TROPONINI in the last 168 hours. BNP: BNP (last 3 results) No results for input(s): BNP in the last 8760 hours.  ProBNP (last 3 results) No results for input(s):  PROBNP in the last 8760 hours.  CBG: No results for input(s): GLUCAP in the last 168 hours.     Signed:  Domenic Polite MD.  [facility] 02/01/2018, 1:51 PM

## 2018-02-14 DIAGNOSIS — D649 Anemia, unspecified: Secondary | ICD-10-CM | POA: Diagnosis not present

## 2018-02-14 DIAGNOSIS — N451 Epididymitis: Secondary | ICD-10-CM | POA: Diagnosis not present

## 2018-02-14 DIAGNOSIS — Z09 Encounter for follow-up examination after completed treatment for conditions other than malignant neoplasm: Secondary | ICD-10-CM | POA: Diagnosis not present

## 2018-02-14 DIAGNOSIS — R197 Diarrhea, unspecified: Secondary | ICD-10-CM | POA: Diagnosis not present

## 2018-02-25 DIAGNOSIS — N319 Neuromuscular dysfunction of bladder, unspecified: Secondary | ICD-10-CM | POA: Diagnosis not present

## 2018-02-25 DIAGNOSIS — N451 Epididymitis: Secondary | ICD-10-CM | POA: Diagnosis not present

## 2018-02-25 DIAGNOSIS — R361 Hematospermia: Secondary | ICD-10-CM | POA: Diagnosis not present

## 2018-04-09 DIAGNOSIS — N319 Neuromuscular dysfunction of bladder, unspecified: Secondary | ICD-10-CM | POA: Diagnosis not present

## 2018-04-09 DIAGNOSIS — N451 Epididymitis: Secondary | ICD-10-CM | POA: Diagnosis not present

## 2018-04-16 DIAGNOSIS — Z0000 Encounter for general adult medical examination without abnormal findings: Secondary | ICD-10-CM | POA: Diagnosis not present

## 2018-04-16 DIAGNOSIS — Z136 Encounter for screening for cardiovascular disorders: Secondary | ICD-10-CM | POA: Diagnosis not present

## 2018-07-10 DIAGNOSIS — H903 Sensorineural hearing loss, bilateral: Secondary | ICD-10-CM | POA: Diagnosis not present

## 2018-07-18 DIAGNOSIS — R300 Dysuria: Secondary | ICD-10-CM | POA: Diagnosis not present

## 2018-08-16 DIAGNOSIS — H18613 Keratoconus, stable, bilateral: Secondary | ICD-10-CM | POA: Diagnosis not present

## 2018-09-05 DIAGNOSIS — N390 Urinary tract infection, site not specified: Secondary | ICD-10-CM | POA: Diagnosis not present

## 2018-09-15 IMAGING — CT CT ABD-PELV W/ CM
2 of 4 series · 15 of 46 positions shown, 17 images · IV contrast (APPLIED)
Comparison: 12/23/2014 CT abdomen.  10/21/2014 CT abdomen/pelvis.

CLINICAL DATA: Fever. Right flank pain. History of renal abscess.
Self catheterization.

EXAM:
CT ABDOMEN AND PELVIS WITH CONTRAST
TECHNIQUE: Multidetector CT imaging of the abdomen and pelvis was performed
using the standard protocol following bolus administration of
intravenous contrast.
CONTRAST:  100mL BZHMSG-PII IOPAMIDOL (BZHMSG-PII) INJECTION 61%

[Series 3: abd/ pelvis 5.0 i30f 2 · axial · 0.71mm/px · z∈[+897,+1362]mm · 12 of 107 slices shown, 14 images]
[im 9/107  soft-tissue]
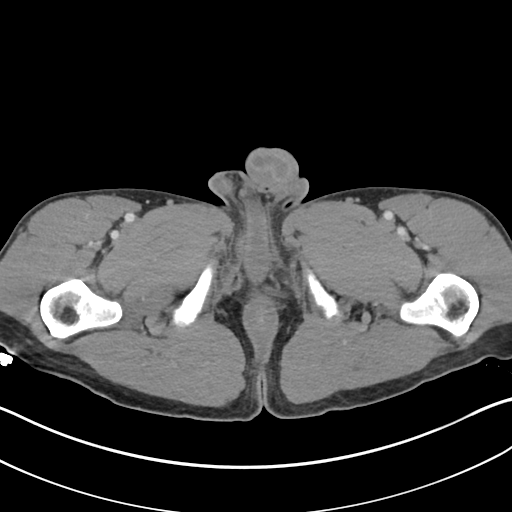
[im 9/107  bone]
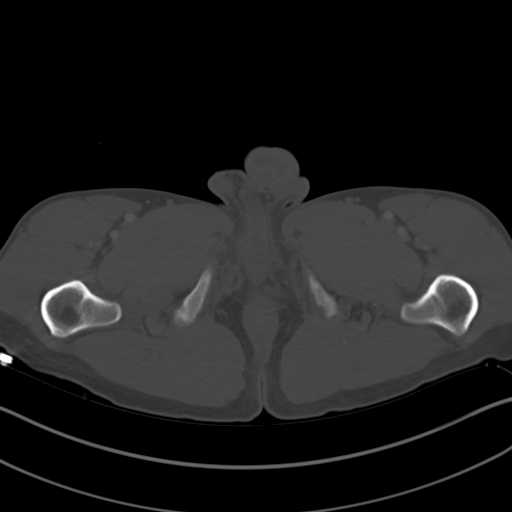
[im 17/107  soft-tissue]
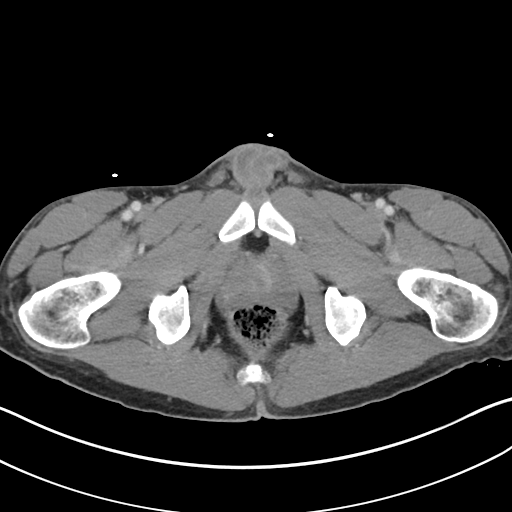
[im 26/107  soft-tissue]
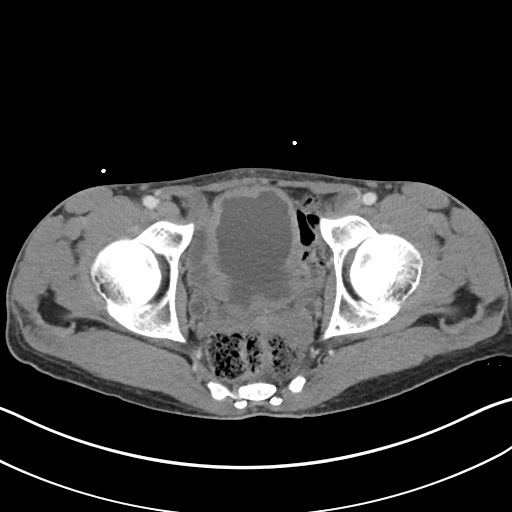
[im 34/107  soft-tissue]
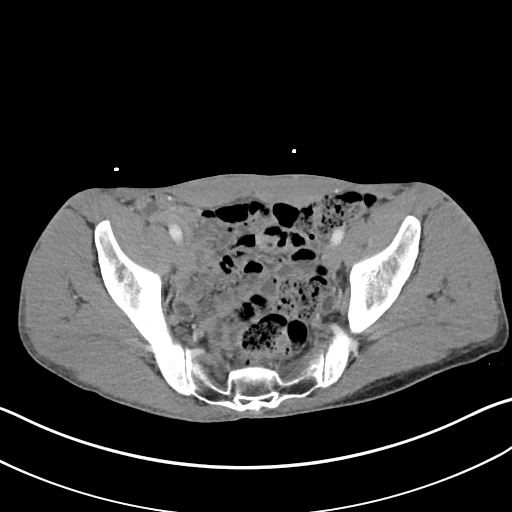
[im 43/107  soft-tissue]
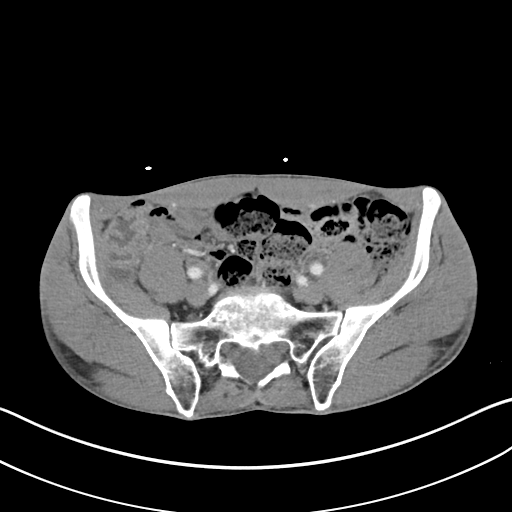
[im 51/107  soft-tissue]
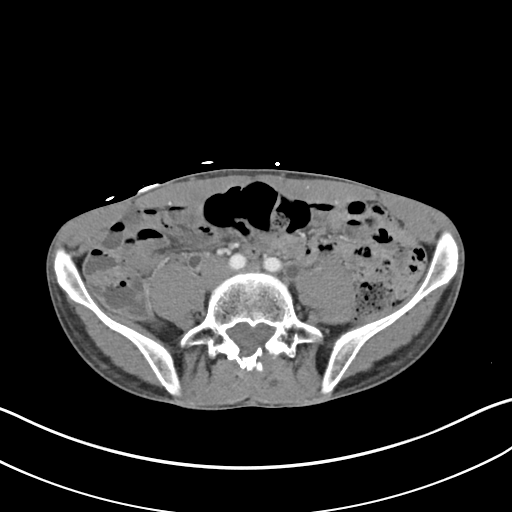
[im 60/107  soft-tissue]
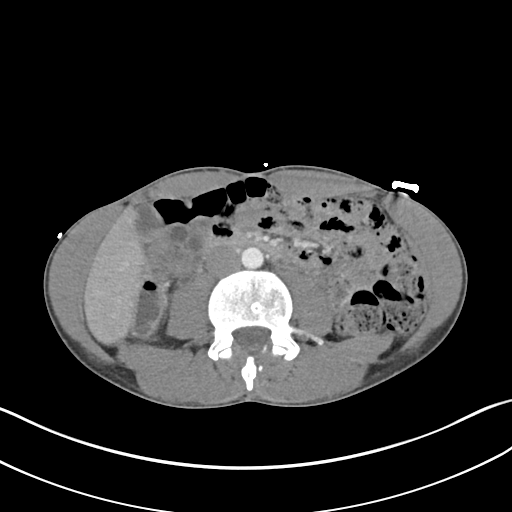
[im 68/107  soft-tissue]
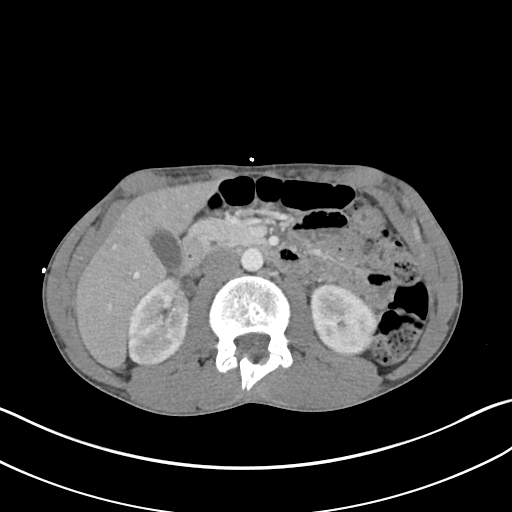
[im 77/107  soft-tissue]
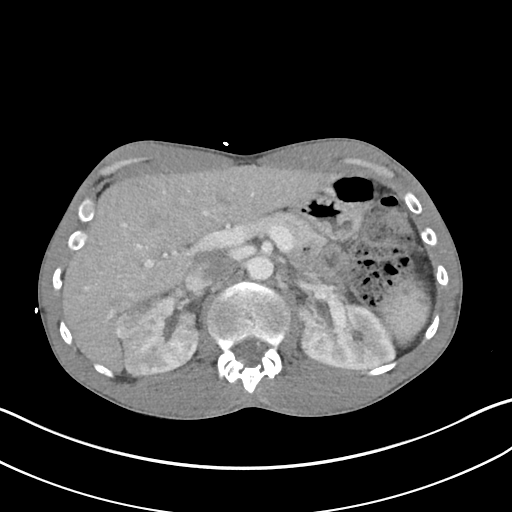
[im 77/107  bone]
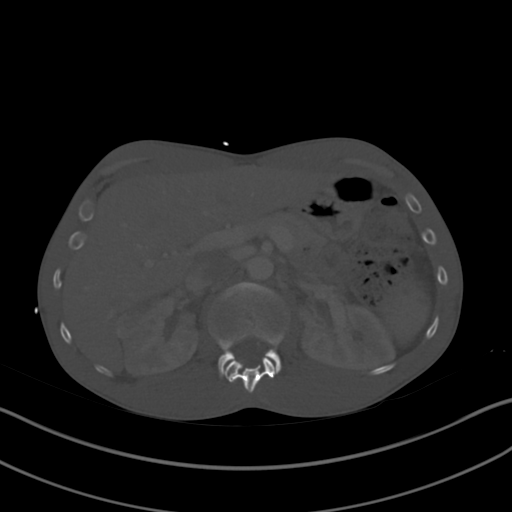
[im 85/107  soft-tissue]
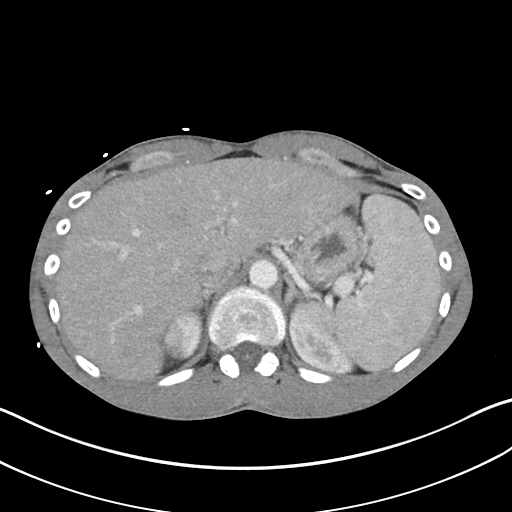
[im 94/107  soft-tissue]
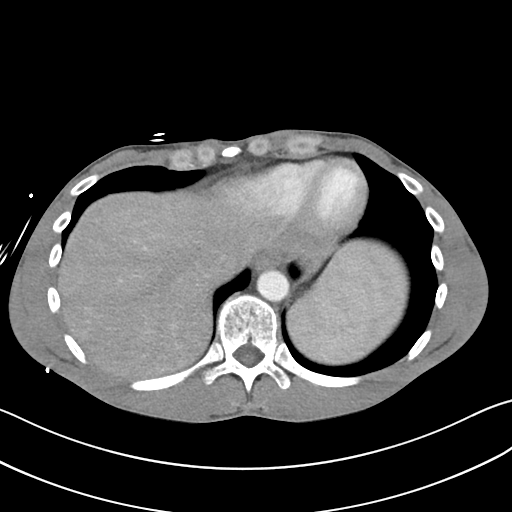
[im 102/107  soft-tissue]
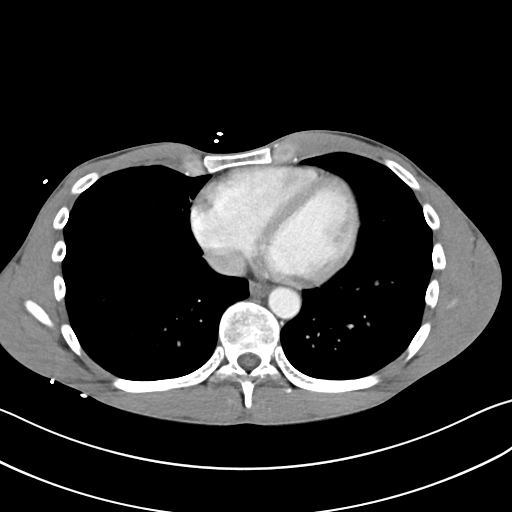

[Series 5: coronal soft tissue · coronal · 0.73mm/px · 3 of 78 slices shown]
[im 26/78  soft-tissue]
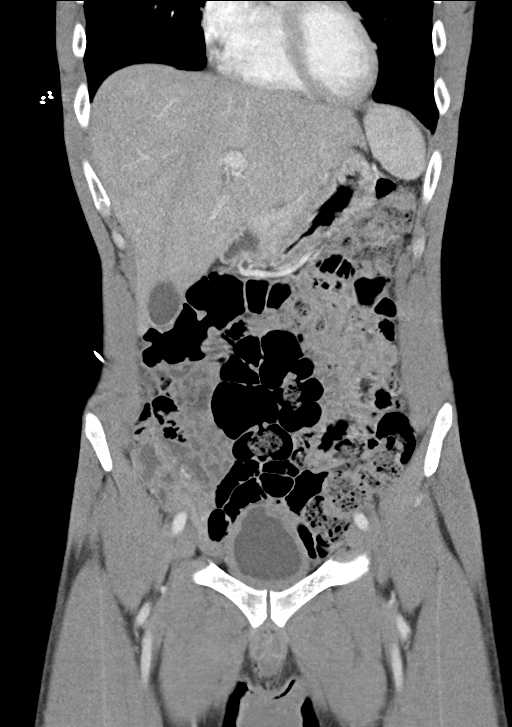
[im 35/78  soft-tissue]
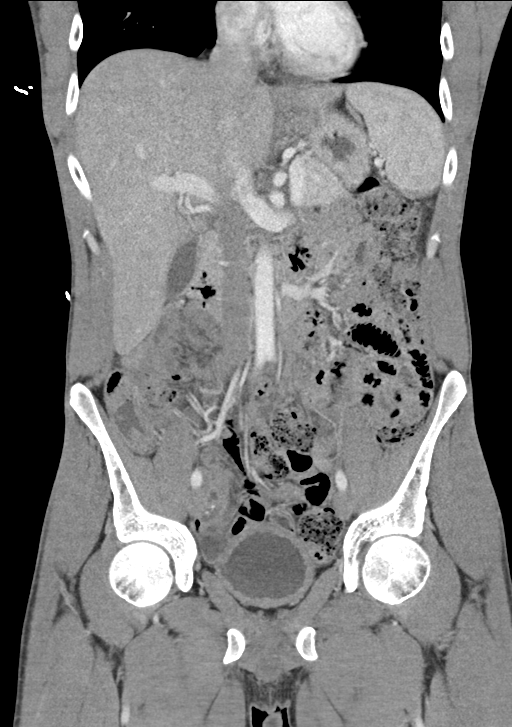
[im 43/78  soft-tissue]
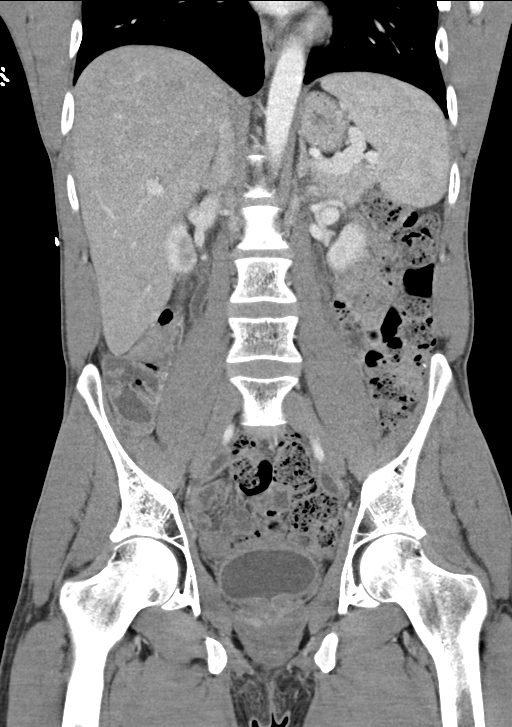

[15 of 46 positions shown; findings below may reference images not displayed]

FINDINGS: Lower chest: No significant pulmonary nodules or acute consolidative
airspace disease.

Hepatobiliary: Normal liver with no liver mass. Normal gallbladder
with no radiopaque cholelithiasis. No biliary ductal dilatation.

Pancreas: Normal, with no mass or duct dilation.

Spleen: Normal size. No mass.

Adrenals/Urinary Tract: Normal adrenals. Mild right
hydroureteronephrosis to the level of the right ureterovesical
junction. Mild diffuse left ureterectasis without overt left
hydronephrosis. Diffuse urothelial wall thickening and
hyperenhancement throughout the ureters and central renal collecting
systems bilaterally. Abnormal patchy enhancement throughout the
right kidney. Several of the dilated right renal calices extend to
the periphery of right renal cortex. Mild patchy enhancement in the
medial upper left kidney. Multifocal mild to moderate renal cortical
scarring throughout the right kidney. Perinephric fat stranding,
asymmetrically prominent on the right. No perinephric fluid
collections. No contour deforming renal masses. Diffuse bladder wall
thickening and hyperenhancement. Suggestion of diffuse mild bladder
trabeculation. No evidence of urolithiasis.

Stomach/Bowel: Grossly normal stomach. Normal caliber small bowel
with no small bowel wall thickening. Appendix not discretely
visualized. No pericecal inflammatory changes. Normal large bowel
with no diverticulosis, large bowel wall thickening or pericolonic
fat stranding.

Vascular/Lymphatic: Normal caliber abdominal aorta. Patent portal,
splenic and renal veins . No pathologically enlarged lymph nodes in
the abdomen or pelvis.

Reproductive: Minimally enlarged prostate. Prostate dimensions 3.1 x
3.5 x 5.0 cm (volume = 28 cm^3).

Other: No pneumoperitoneum, ascites or focal fluid collection.

Musculoskeletal: No aggressive appearing focal osseous lesions.
IMPRESSION: 1. Diffusely thick walled, trabeculated hyperenhancing bladder wall,
compatible with acute cystitis with probable underlying chronic
bladder voiding dysfunction.
2. Mild right hydroureteronephrosis. Diffuse left ureterectasis
without left hydronephrosis. Diffuse urothelial wall thickening and
hyperenhancement in the renal collecting systems and ureters
compatible with bilateral ascending urinary tract infection.
3. Bilateral acute pyelonephritis, significantly more severe in the
right kidney. No discrete renal abscess or perinephric fluid
collections. Pre-existing multifocal renal cortical scarring in the
right kidney.
4. Minimally enlarged prostate.

## 2018-09-19 DIAGNOSIS — R339 Retention of urine, unspecified: Secondary | ICD-10-CM | POA: Diagnosis not present

## 2018-09-19 DIAGNOSIS — N390 Urinary tract infection, site not specified: Secondary | ICD-10-CM | POA: Diagnosis not present

## 2018-09-19 DIAGNOSIS — A4153 Sepsis due to Serratia: Secondary | ICD-10-CM | POA: Diagnosis not present

## 2018-09-19 DIAGNOSIS — R350 Frequency of micturition: Secondary | ICD-10-CM | POA: Diagnosis not present

## 2018-09-19 DIAGNOSIS — R3915 Urgency of urination: Secondary | ICD-10-CM | POA: Diagnosis not present

## 2019-03-22 IMAGING — US US SCROTUM W/ DOPPLER COMPLETE
1 series · 13 of 25 positions shown · non-contrast
Comparison: Prior ultrasound from 01/24/2018.

CLINICAL DATA: Initial evaluation for acute right testicular pain.

EXAM:
SCROTAL ULTRASOUND
DOPPLER ULTRASOUND OF THE TESTICLES
TECHNIQUE: Complete ultrasound examination of the testicles, epididymis, and
other scrotal structures was performed. Color and spectral Doppler
ultrasound were also utilized to evaluate blood flow to the
testicles.

[Series 1: us scrotum w/ doppler complete · 0.07mm/px · 13 of 52 slices shown]
[im 1/52]
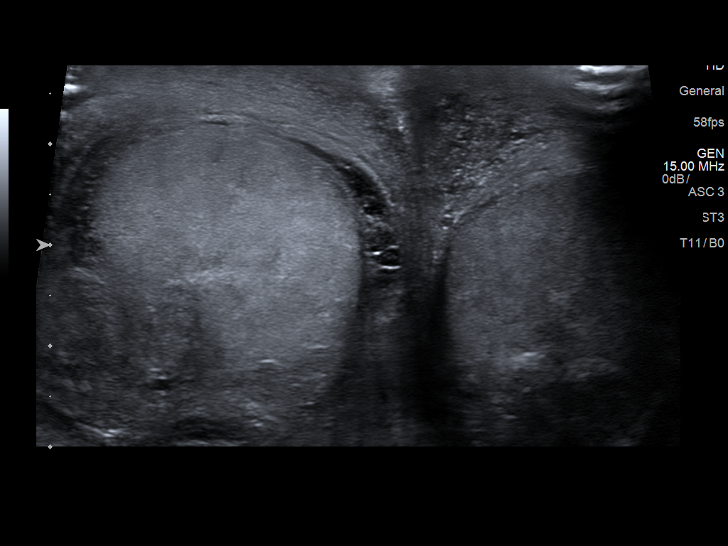
[im 5/52]
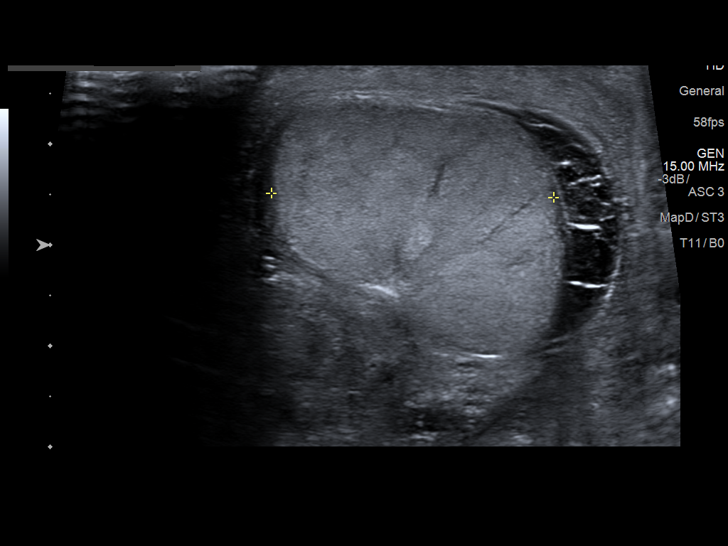
[im 9/52]
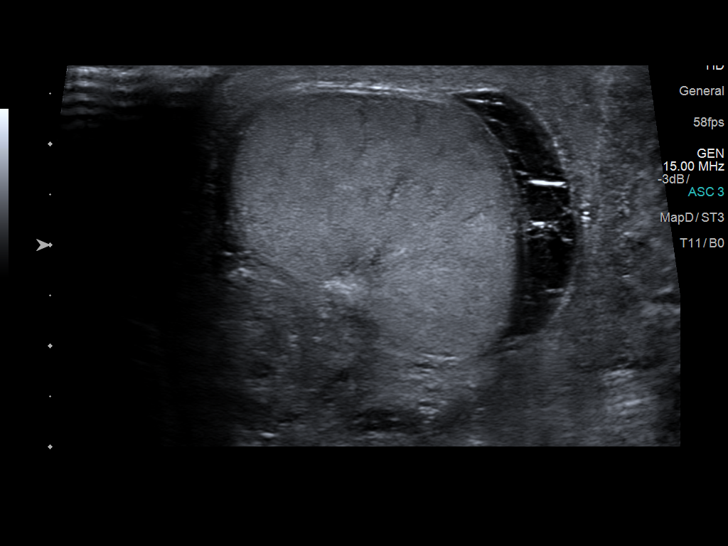
[im 13/52]
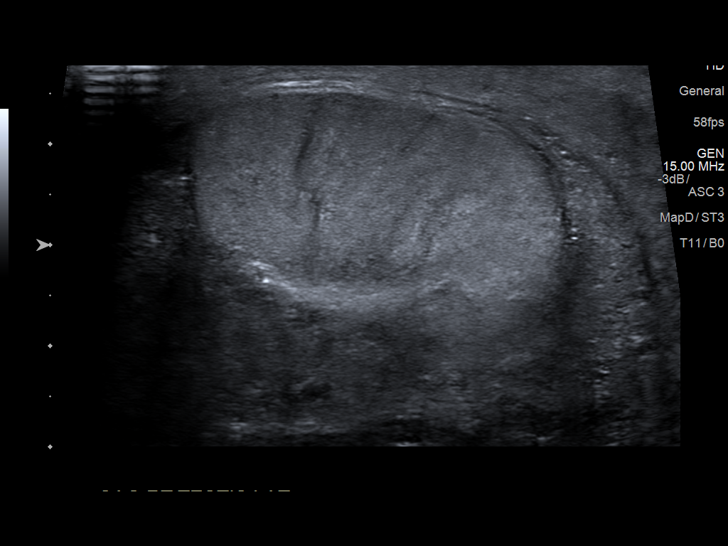
[im 18/52]
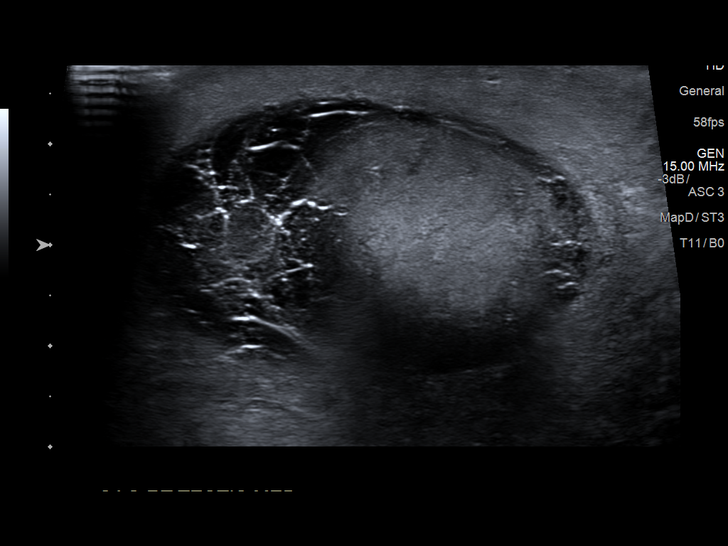
[im 22/52]
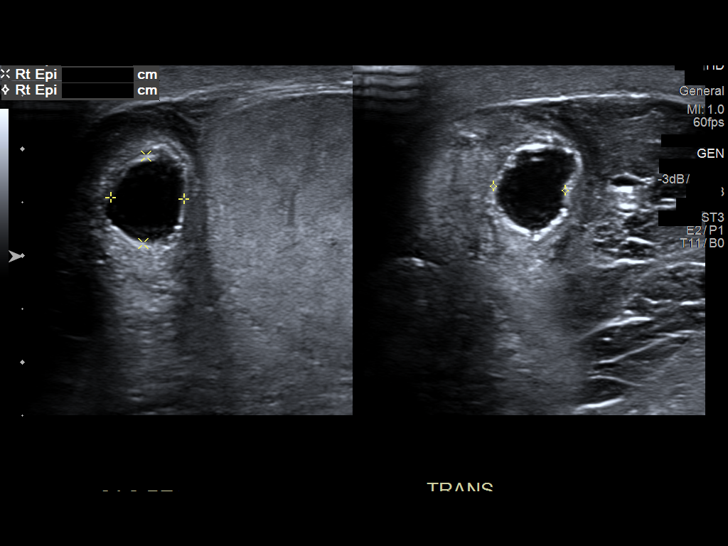
[im 26/52]
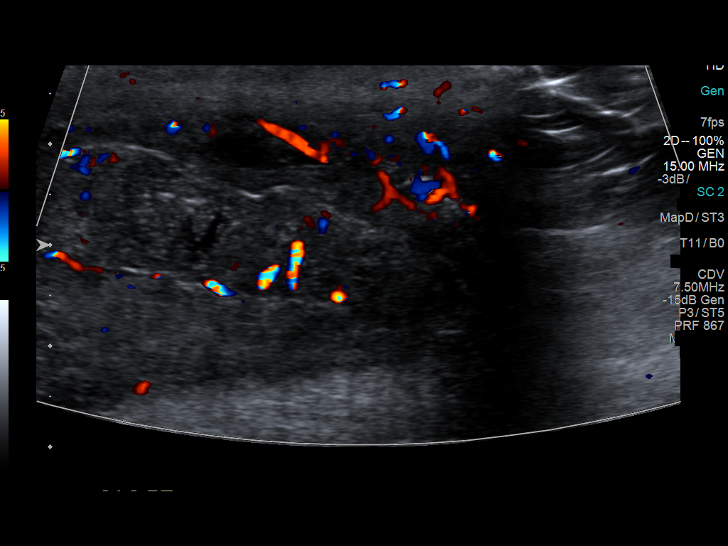
[im 30/52]
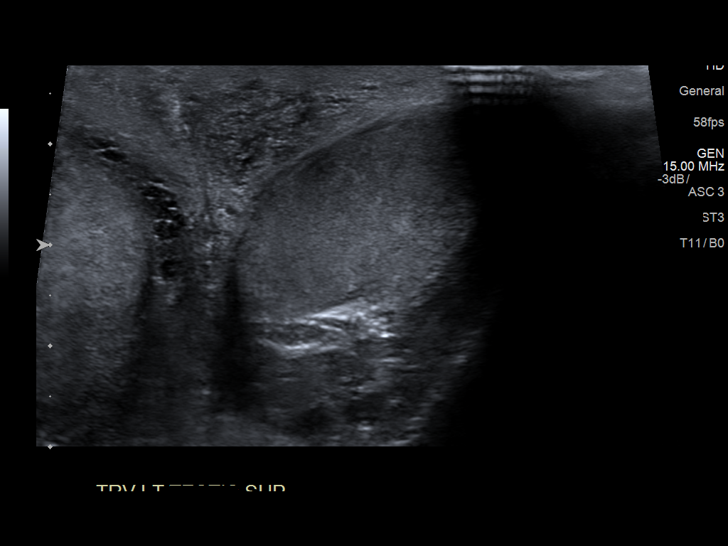
[im 35/52]
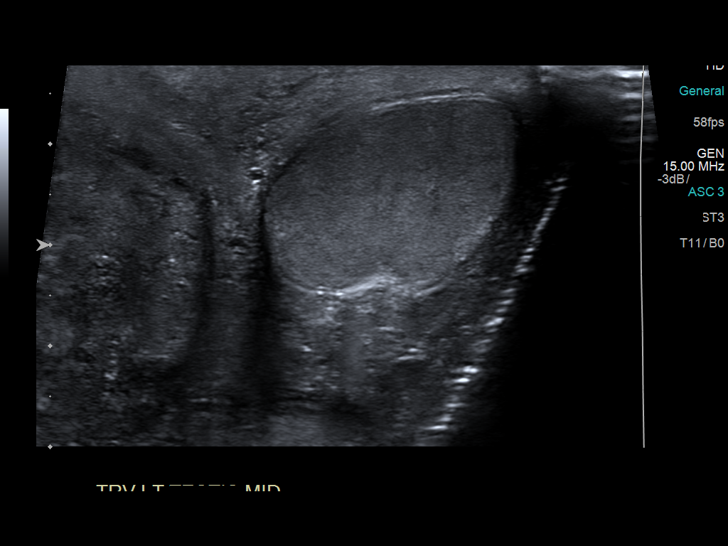
[im 39/52]
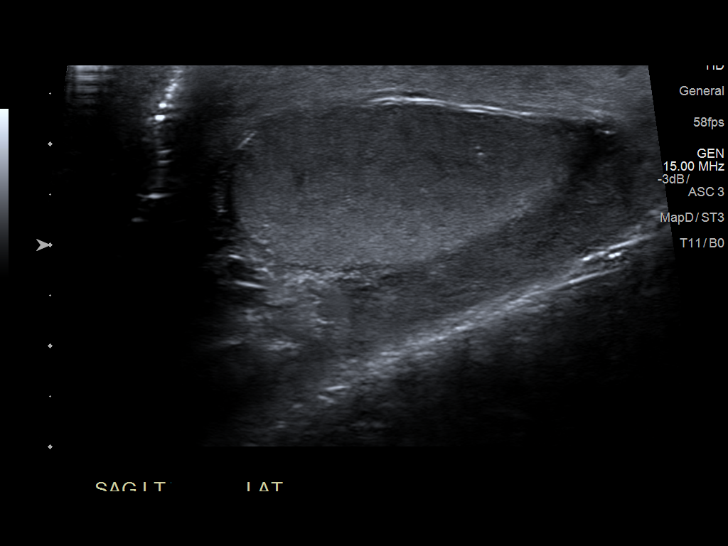
[im 43/52]
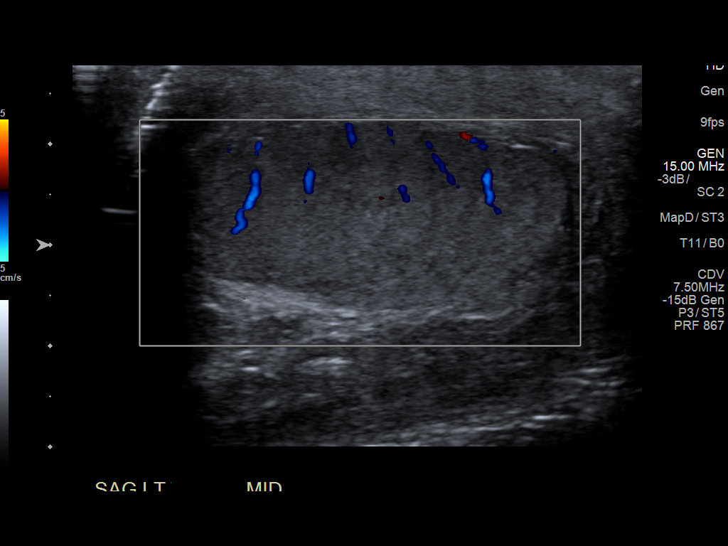
[im 47/52]
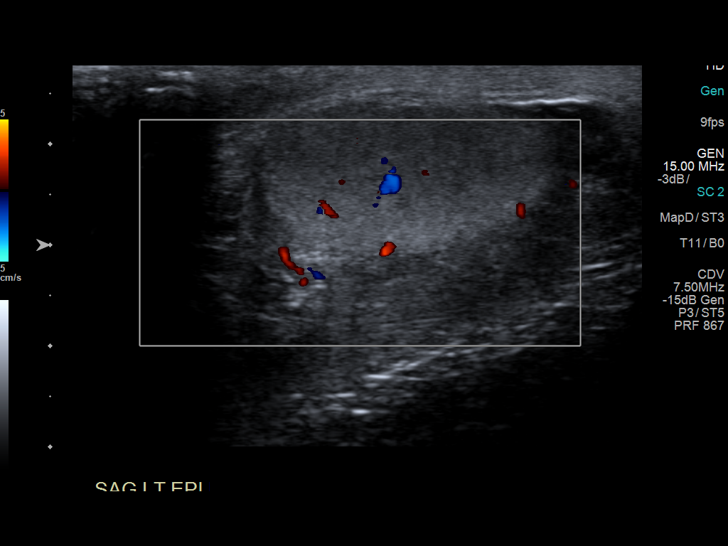
[im 52/52]
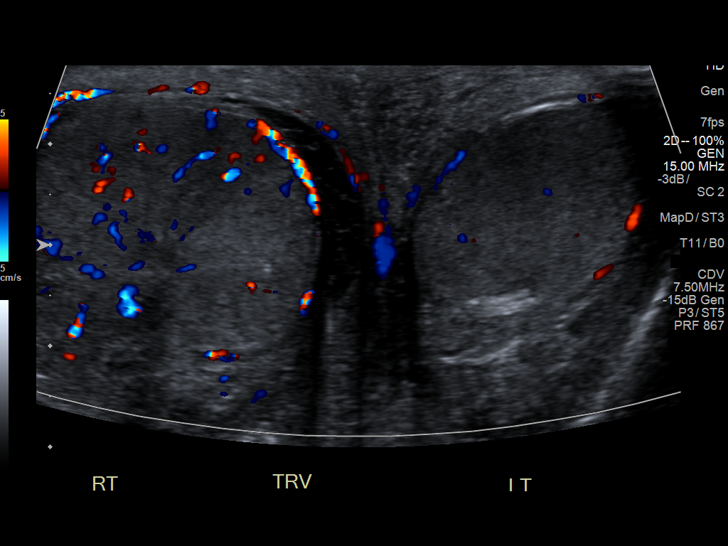

[13 of 25 positions shown; findings below may reference images not displayed]

FINDINGS: Right testicle

Measurements: 3.8 x 2.8 x 2.8 cm. No mass or microlithiasis
visualized. Right testicle not demonstrates a somewhat heterogeneous
echotexture with increased vascularity as compared to the left.

Left testicle

Measurements: 3.9 x 1.9 x 2.2 cm. No mass lesion. Few scattered
microcalcifications noted, similar to previous.

Right epididymis: Right epididymis head heterogeneous and hyperemic,
consistent with epididymitis. 8 mm right epididymal head cyst again
noted.

Left epididymis:  Normal in size and appearance.

Hydrocele: Interval development of complex right-sided hydrocele
with scattered internal septations. No frank abscess.

Varicocele:  None visualized.

Pulsed Doppler interrogation of both testes demonstrates normal low
resistance arterial and venous waveforms bilaterally.
IMPRESSION: 1. Persistent findings consistent with acute right-sided
epididymitis, with interval development of associated right-sided
orchitis.
2. Interval development of complex and septated right-sided
hydrocele. No frank abscess.
3. Mild left testicular microlithiasis.

## 2019-06-14 ENCOUNTER — Encounter (HOSPITAL_COMMUNITY): Payer: Self-pay | Admitting: *Deleted

## 2019-06-14 ENCOUNTER — Other Ambulatory Visit: Payer: Self-pay

## 2019-06-14 ENCOUNTER — Ambulatory Visit (HOSPITAL_COMMUNITY)
Admission: EM | Admit: 2019-06-14 | Discharge: 2019-06-14 | Disposition: A | Discharge status: Home / Self Care | Payer: 59 | Attending: Family Medicine | Admitting: Family Medicine

## 2019-06-14 ENCOUNTER — Ambulatory Visit (INDEPENDENT_AMBULATORY_CARE_PROVIDER_SITE_OTHER): Payer: 59

## 2019-06-14 DIAGNOSIS — S92354A Nondisplaced fracture of fifth metatarsal bone, right foot, initial encounter for closed fracture: Secondary | ICD-10-CM
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
## 2019-06-14 NOTE — Discharge Instructions (Signed)
Return if any problems.

## 2019-07-31 ENCOUNTER — Emergency Department (HOSPITAL_COMMUNITY): Payer: 59

## 2019-07-31 ENCOUNTER — Other Ambulatory Visit: Payer: Self-pay

## 2019-07-31 ENCOUNTER — Emergency Department (HOSPITAL_COMMUNITY)
Admission: EM | Admit: 2019-07-31 | Discharge: 2019-07-31 | Disposition: A | Discharge status: Home / Self Care | Payer: 59 | Attending: Emergency Medicine | Admitting: Emergency Medicine

## 2019-07-31 ENCOUNTER — Encounter (HOSPITAL_COMMUNITY): Payer: Self-pay | Admitting: Emergency Medicine

## 2019-07-31 DIAGNOSIS — R55 Syncope and collapse: Secondary | ICD-10-CM

## 2019-07-31 DIAGNOSIS — R531 Weakness: Secondary | ICD-10-CM | POA: Diagnosis not present

## 2019-07-31 DIAGNOSIS — R05 Cough: Secondary | ICD-10-CM | POA: Diagnosis not present

## 2019-07-31 DIAGNOSIS — U071 COVID-19: Secondary | ICD-10-CM

## 2019-07-31 DIAGNOSIS — R42 Dizziness and giddiness: Secondary | ICD-10-CM | POA: Diagnosis not present

## 2019-07-31 DIAGNOSIS — E860 Dehydration: Secondary | ICD-10-CM | POA: Diagnosis not present

## 2019-07-31 DIAGNOSIS — R519 Headache, unspecified: Secondary | ICD-10-CM | POA: Insufficient documentation

## 2019-07-31 LAB — CBC WITH DIFFERENTIAL/PLATELET
Abs Immature Granulocytes: 0.01 10*3/uL (ref 0.00–0.07)
Basophils Absolute: 0 10*3/uL (ref 0.0–0.1)
Basophils Relative: 0 %
Eosinophils Absolute: 0 10*3/uL (ref 0.0–0.5)
Eosinophils Relative: 0 %
HCT: 38.7 % — ABNORMAL LOW (ref 39.0–52.0)
Hemoglobin: 12.6 g/dL — ABNORMAL LOW (ref 13.0–17.0)
Immature Granulocytes: 0 %
Lymphocytes Relative: 20 %
Lymphs Abs: 0.5 10*3/uL — ABNORMAL LOW (ref 0.7–4.0)
MCH: 33.2 pg (ref 26.0–34.0)
MCHC: 32.6 g/dL (ref 30.0–36.0)
MCV: 101.8 fL — ABNORMAL HIGH (ref 80.0–100.0)
Monocytes Absolute: 0.3 10*3/uL (ref 0.1–1.0)
Monocytes Relative: 13 %
Neutro Abs: 1.5 10*3/uL — ABNORMAL LOW (ref 1.7–7.7)
Neutrophils Relative %: 67 %
Platelets: 84 10*3/uL — ABNORMAL LOW (ref 150–400)
RBC: 3.8 MIL/uL — ABNORMAL LOW (ref 4.22–5.81)
RDW: 11.4 % — ABNORMAL LOW (ref 11.5–15.5)
WBC: 2.3 10*3/uL — ABNORMAL LOW (ref 4.0–10.5)
nRBC: 0 % (ref 0.0–0.2)

## 2019-07-31 LAB — COMPREHENSIVE METABOLIC PANEL
ALT: 16 U/L (ref 0–44)
AST: 24 U/L (ref 15–41)
Albumin: 3.5 g/dL (ref 3.5–5.0)
Alkaline Phosphatase: 56 U/L (ref 38–126)
Anion gap: 10 (ref 5–15)
BUN: 14 mg/dL (ref 6–20)
CO2: 25 mmol/L (ref 22–32)
Calcium: 8.4 mg/dL — ABNORMAL LOW (ref 8.9–10.3)
Chloride: 100 mmol/L (ref 98–111)
Creatinine, Ser: 1.36 mg/dL — ABNORMAL HIGH (ref 0.61–1.24)
GFR calc Af Amer: >60 mL/min (ref >60)
GFR calc non Af Amer: >60 mL/min (ref >60)
Glucose, Bld: 102 mg/dL — ABNORMAL HIGH (ref 70–99)
Potassium: 4 mmol/L (ref 3.5–5.1)
Sodium: 135 mmol/L (ref 135–145)
Total Bilirubin: 0.4 mg/dL (ref 0.3–1.2)
Total Protein: 6.2 g/dL — ABNORMAL LOW (ref 6.5–8.1)

## 2019-07-31 LAB — URINALYSIS, ROUTINE W REFLEX MICROSCOPIC
Bilirubin Urine: NEGATIVE
Glucose, UA: NEGATIVE mg/dL
Hgb urine dipstick: NEGATIVE
Ketones, ur: NEGATIVE mg/dL
Leukocytes,Ua: NEGATIVE
Nitrite: NEGATIVE
Protein, ur: NEGATIVE mg/dL
Specific Gravity, Urine: 1.014 (ref 1.005–1.030)
pH: 6 (ref 5.0–8.0)

## 2019-07-31 LAB — TROPONIN I (HIGH SENSITIVITY)
Troponin I (High Sensitivity): 3 ng/L (ref <18)
Troponin I (High Sensitivity): 4 ng/L (ref <18)

## 2019-07-31 LAB — CBG MONITORING, ED: Glucose-Capillary: 92 mg/dL (ref 70–99)

## 2019-07-31 LAB — POC SARS CORONAVIRUS 2 AG -  ED: SARS Coronavirus 2 Ag: POSITIVE — AB

## 2019-07-31 MED ORDER — ACETAMINOPHEN 500 MG PO TABS: 500.0000 mg | ORAL_TABLET | Freq: Four times a day (QID) | ORAL | 0 refills | Status: AC | PRN

## 2019-07-31 MED ORDER — BENZONATATE 100 MG PO CAPS: 100.0000 mg | ORAL_CAPSULE | Freq: Three times a day (TID) | ORAL | 0 refills | Status: AC

## 2019-07-31 MED ORDER — IOHEXOL 350 MG/ML SOLN
100.0000 mL | Freq: Once | INTRAVENOUS | Status: AC | PRN
  Administered 2019-07-31: 100 mL via INTRAVENOUS

## 2019-07-31 MED ORDER — SODIUM CHLORIDE 0.9 % IV BOLUS
1000.0000 mL | Freq: Once | INTRAVENOUS | Status: AC
  Administered 2019-07-31: 1000 mL via INTRAVENOUS

## 2019-07-31 NOTE — Discharge Instructions (Signed)
You have been diagnosed with COVID-19 infection. Please rest and stay hydrated.  Follow direction below.  Return if your condition worsen, if you have increased shortness of breath or if you have other concerns.

## 2019-07-31 NOTE — ED Provider Notes (Signed)
Went to [facility] for return to work note but had a syncopal episode, prompting the ER visit.  Pt is deaf, communicate through writing.  Need orthostatic vital sign, labs, and IVF.    Recent finished abx for UTI recently, does do self cath Q6hr. Urine culture sent. Pt did report receiving fluid at [facility] and dizziness went away.  Hx brain tumor, need brain MRI w/o CM.   8:01 PM Brain MRI without acute finding.  Patient resting comfortably.  With orthostatic vital sign, he does have increased heart rate with normal blood pressure.  He does report for the past 4 days he has had cold symptoms including generalized fatigue, body aches, occasional cough and pleuritic chest pain.  He initially had a Covid test done and was told that was negative.  In the setting of syncope and having some shortness of breath we did discuss concerns for potential underlying PE that may contribute to his symptoms.  After discussion, patient elected to have chest CT angiogram to rule out PE.  10:19 PM COVID-19 test came back positive.  Chest CT angiogram without any evidence of PE however he does have signs of multifocal pneumonia consistent with viral infection.  At this time he is not hypoxic, resting comfortably, felt better with IV fluid.  He does not need to be admitted at this time.  Will provide symptomatic treatment, recommend self quarantine and follows CDC guideline as appropriate.  Work note provided.  Strict return precaution discussed.  Patient voiced understanding and agrees with plan.  Communication via writing due to patient's underlying history of hearing impairment.    Andrew Higgins was evaluated in Emergency Department on 07/31/2019 for the symptoms described in the history of present illness. He was evaluated in the context of the global COVID-19 pandemic, which necessitated consideration that the patient might be at risk for infection with the SARS-CoV-2 virus that causes COVID-19. Institutional protocols and algorithms  that pertain to the evaluation of patients at risk for COVID-19 are in a state of rapid change based on information released by regulatory bodies including the CDC and federal and state organizations. These policies and algorithms were followed during the patient's care in the ED.   BP (!) 133/91   Pulse 77   Temp 98.8 F (37.1 C) (Oral)   Resp 11   SpO2 100%   Results for orders placed or performed during the hospital encounter of 07/31/19  CBC with Differential  Result Value Ref Range   WBC 2.3 (L) 4.0 - 10.5 K/uL   RBC 3.80 (L) 4.22 - 5.81 MIL/uL   Hemoglobin 12.6 (L) 13.0 - 17.0 g/dL   HCT 38.7 (L) 39.0 - 52.0 %   MCV 101.8 (H) 80.0 - 100.0 fL   MCH 33.2 26.0 - 34.0 pg   MCHC 32.6 30.0 - 36.0 g/dL   RDW 11.4 (L) 11.5 - 15.5 %   Platelets 84 (L) 150 - 400 K/uL   nRBC 0.0 0.0 - 0.2 %   Neutrophils Relative % 67 %   Neutro Abs 1.5 (L) 1.7 - 7.7 K/uL   Lymphocytes Relative 20 %   Lymphs Abs 0.5 (L) 0.7 - 4.0 K/uL   Monocytes Relative 13 %   Monocytes Absolute 0.3 0.1 - 1.0 K/uL   Eosinophils Relative 0 %   Eosinophils Absolute 0.0 0.0 - 0.5 K/uL   Basophils Relative 0 %   Basophils Absolute 0.0 0.0 - 0.1 K/uL   Immature Granulocytes 0 %   Abs Immature  Granulocytes 0.01 0.00 - 0.07 K/uL  Comprehensive metabolic panel  Result Value Ref Range   Sodium 135 135 - 145 mmol/L   Potassium 4.0 3.5 - 5.1 mmol/L   Chloride 100 98 - 111 mmol/L   CO2 25 22 - 32 mmol/L   Glucose, Bld 102 (H) 70 - 99 mg/dL   BUN 14 6 - 20 mg/dL   Creatinine, Ser 1.36 (H) 0.61 - 1.24 mg/dL   Calcium 8.4 (L) 8.9 - 10.3 mg/dL   Total Protein 6.2 (L) 6.5 - 8.1 g/dL   Albumin 3.5 3.5 - 5.0 g/dL   AST 24 15 - 41 U/L   ALT 16 0 - 44 U/L   Alkaline Phosphatase 56 38 - 126 U/L   Total Bilirubin 0.4 0.3 - 1.2 mg/dL   GFR calc non Af Amer >60 >60 mL/min   GFR calc Af Amer >60 >60 mL/min   Anion gap 10 5 - 15  Urinalysis, Routine w reflex microscopic  Result Value Ref Range   Color, Urine YELLOW YELLOW    APPearance CLEAR CLEAR   Specific Gravity, Urine 1.014 1.005 - 1.030   pH 6.0 5.0 - 8.0   Glucose, UA NEGATIVE NEGATIVE mg/dL   Hgb urine dipstick NEGATIVE NEGATIVE   Bilirubin Urine NEGATIVE NEGATIVE   Ketones, ur NEGATIVE NEGATIVE mg/dL   Protein, ur NEGATIVE NEGATIVE mg/dL   Nitrite NEGATIVE NEGATIVE   Leukocytes,Ua NEGATIVE NEGATIVE  CBG monitoring, ED  Result Value Ref Range   Glucose-Capillary 92 70 - 99 mg/dL  POC SARS Coronavirus 2 Ag-ED - Nasal Swab (BD Veritor Kit)  Result Value Ref Range   SARS Coronavirus 2 Ag POSITIVE (A) NEGATIVE  Troponin I (High Sensitivity)  Result Value Ref Range   Troponin I (High Sensitivity) 4 <18 ng/L  Troponin I (High Sensitivity)  Result Value Ref Range   Troponin I (High Sensitivity) 3 <18 ng/L   CT Head Wo Contrast  Result Date: 07/31/2019 CLINICAL DATA:  Headache. Normal neurological exam. Near syncope. 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 EXAM: CT HEAD WITHOUT CONTRAST TECHNIQUE: Contiguous axial images were obtained from the base of the skull through the vertex without intravenous contrast. COMPARISON:  MRI 10/23/2014 FINDINGS: Brain: Cerebellar atrophy as seen previously. No sign of acute infarction, mass lesion, hemorrhage, hydrocephalus or extra-axial collection. 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 Vascular: No abnormal vascular finding. Skull: Normal Sinuses/Orbits: Clear/normal Other: None IMPRESSION: No acute finding by CT. Chronic cerebellar atrophy. Normal CT appearance of the cerebral hemispheres. This is remarkable given the pronounced changes of superficial siderosis demonstrated on the MRI of April 2016. Electronically Signed   By: Nelson Chimes M.D.   On: 07/31/2019 15:30   CT Angio Chest PE W and/or Wo Contrast  Result Date: 07/31/2019 CLINICAL DATA:  Syncope. COVID positive. EXAM: CT ANGIOGRAPHY CHEST WITH CONTRAST TECHNIQUE:  Multidetector CT imaging of the chest was performed using the standard protocol during bolus administration of intravenous contrast. Multiplanar CT image reconstructions and MIPs were obtained to evaluate the vascular anatomy. CONTRAST:  172m OMNIPAQUE IOHEXOL 350 MG/ML SOLN COMPARISON:  None. FINDINGS: Cardiovascular: --Pulmonary arteries: Contrast injection is sufficient to demonstrate satisfactory opacification of the pulmonary arteries to the segmental level, with attenuation of at least 200 HU at the main pulmonary artery. There is no pulmonary embolus. The main pulmonary artery is within normal limits for size. --Aorta: Limited opacification of the aorta due to  bolus timing optimization for the pulmonary arteries. The course and caliber of the aorta are normal. Conventional 3 vessel aortic branching pattern. There is no aortic atherosclerosis. --Heart: Normal size. No pericardial effusion. Mediastinum/Nodes: No mediastinal, hilar or axillary lymphadenopathy. The visualized thyroid and thoracic esophageal course are unremarkable. Lungs/Pleura: There is multifocal ground-glass opacity within both lungs, predominantly within the dependent portions of the lower lobes. Upper Abdomen: Contrast bolus timing is not optimized for evaluation of the abdominal organs. The visualized portions of the organs of the upper abdomen are normal. Musculoskeletal: No chest wall abnormality. No bony spinal canal stenosis. Review of the MIP images confirms the above findings. IMPRESSION: 1. No pulmonary embolus. 2. Multifocal ground-glass opacity within both lungs, predominantly within the dependent portions of the lower lobes. This likely indicates multifocal pneumonia. Electronically Signed   By: Ulyses Jarred M.D.   On: 07/31/2019 21:48   MR BRAIN WO CONTRAST  Result Date: 07/31/2019 CLINICAL DATA:  Syncopal episode. Headache. Dizziness. History of spinal tumor. EXAM: MRI HEAD WITHOUT CONTRAST TECHNIQUE: Multiplanar, multiecho  pulse sequences of the brain and surrounding structures were obtained without intravenous contrast. COMPARISON:  Head CT earlier same day.  MRI 10/23/2014 FINDINGS: Brain: Diffusion imaging does not show any acute or subacute infarction. Again demonstrated is pronounced superficial siderosis, more severe in the posterior fossa. Pronounced cerebellar atrophy, particularly of the superior cerebellum. Cerebral hemispheres do not show any focal large or small vessel infarction. No sign of mass lesion. No sign of recent hemorrhage. No hydrocephalus or extra-axial collection. Vascular: Major vessels at the base of the brain show flow. Skull and upper cervical spine: Negative Sinuses/Orbits: Clear/normal Other: None IMPRESSION: No acute finding. 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 Chronic cerebellar atrophy, more pronounced superiorly. Electronically Signed   By: Nelson Chimes M.D.   On: 07/31/2019 19:26   DG Chest Portable 1 View  Result Date: 07/31/2019 CLINICAL DATA:  Syncope EXAM: PORTABLE CHEST 1 VIEW COMPARISON:  11/05/2016 FINDINGS: The heart size and mediastinal contours are within normal limits. No focal airspace consolidation, pleural effusion, or pneumothorax. The visualized skeletal structures are unremarkable. IMPRESSION: No active disease. Electronically Signed   By: Davina Poke D.O.   On: 07/31/2019 15:01      Domenic Moras, PA-C 07/31/19 2228    Daleen Bo, MD 08/04/19 [phone]

## 2019-07-31 NOTE — ED Notes (Signed)
CBG Results of 92 reported to [place], Therapist, sports.

## 2019-07-31 NOTE — ED Provider Notes (Signed)
[facility] EMERGENCY DEPARTMENT Provider Note   CSN: [id number] Arrival date & time: 07/31/19  1334     History Chief Complaint  Patient presents with  . Hypotension    Andrew Higgins is a 44 y.o. male with past medical history significant for spinal tumor 2016, followed by [facility], last MRI in May without any return of disease, deaf, TBI who presents for evaluation of syncope.  Patient states he has had cough, congestion and generalized weakness over the last week.  He had negative Covid test outpatient.  Patient states he was at urgent care today to get a return to work note however he still feels intermittently weak, lightheaded and dizzy.  Patient was at the front desk when he became diaphoretic, pale and had a syncopal event.  Patient states he has had a headache since Monday as well as some intermittent dizziness.  Nuys any recent falls, injury or anticoagulation.  Patient denies prior history of syncopal event.  He self caths 6 times a day due to risk of complications from her spinal removal.  He recently completed Keflex on Sunday for UTI.  Patient denies blurred vision, sudden onset thunderclap headache, neck pain, neck stiffness, chest pain, shortness of breath, abdominal pain, diarrhea, dysuria, unilateral weakness.  Denies additional aggravating or alleviating factors.  Denies IV drug use, bowel incontinence, he has a self cath at baseline, saddle paresthesia.  He does have some trace numbness to bilateral posterior medial calves however patient states this is at baseline from [id number] his complication from his surgery.  Patient states paresthesias are at baseline.  Patient with near syncope, no actual syncopal event or LOC.  No anticoagulation.  He denies hitting his head.  History obtained from patient and past medical records.  No interpreter is used.   Patient is severely hard of hearing and only reads lips.  He does NOT know sign language.  Unfortunately due to the Covid  pandemic we are waering masks given he has a known Covid exposure 1 week ago so history is limited.  He is able to write back and forth and speak answers to questions.  HPI     Past Medical History:  Diagnosis Date  . Cancer ([place]) 2016   SPINAL TUMOR  . Hard of hearing   . 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 Right epididymitis 01/27/2018  . Anemia 01/27/2018  . Pyelonephritis 11/05/2016  . ARF (acute renal failure) ([place]) 11/05/2016  . Thrombocytopenia ([place]) 11/05/2016  . 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 Hydronephrosis 10/21/2014  . Urinary retention 10/21/2014  . Sepsis ([place]) 10/21/2014  . Renal abscess     Past Surgical History:  Procedure Laterality Date  . TUMOR REMOVAL  2016   SPINAL TUMOR-PARTIALLY REMOVED       Family History  Problem Relation Age of Onset  . CAD Father   . Diabetes Mellitus II Neg Hx     Social History   Tobacco Use  . Smoking status: Never Smoker  . Smokeless tobacco: Never Used  Substance Use Topics  . Alcohol use: Yes    Comment: rare  . Drug use: No    Home Medications Prior to Admission medications   Not on File    Allergies    Phenobarbital and Fish allergy  Review of Systems   Review of Systems  Constitutional: Positive for  activity change, chills and fatigue. Negative for appetite change, diaphoresis and fever.  HENT: Negative.   Respiratory: Positive for cough. Negative for apnea, choking, chest tightness, shortness of breath, wheezing and stridor.   Cardiovascular: Negative.   Gastrointestinal: Negative.   Genitourinary: Negative.   Musculoskeletal: Negative.   Skin: Negative.   Neurological: Positive for dizziness, weakness (Generalized) and headaches. Negative for tremors, seizures, syncope, facial asymmetry, speech difficulty, light-headedness and numbness.  All other systems  reviewed and are negative.   Physical Exam Updated Vital Signs BP 129/67 (BP Location: Right Arm)   Pulse 87   Temp 98.6 F (37 C) (Oral)   Resp 18   SpO2 99%   Physical Exam Vitals and nursing note reviewed.  Constitutional:      General: He is not in acute distress.    Appearance: He is well-developed. He is not ill-appearing, toxic-appearing or diaphoretic.     Comments: Unable to hear  HENT:     Head: Normocephalic and atraumatic.     Jaw: There is normal jaw occlusion.     Nose: Nose normal.     Mouth/Throat:     Mouth: Mucous membranes are moist.     Pharynx: Oropharynx is clear.  Eyes:     Pupils: Pupils are equal, round, and reactive to light.     Comments: EOMs intact.  No nystagmus.  Neck:     Trachea: Phonation normal.     Comments: No neck stiffness or neck rigidity.  No JVD or carotid bruit Cardiovascular:     Rate and Rhythm: Normal rate and regular rhythm.     Pulses: Normal pulses.          Radial pulses are 2+ on the right side and 2+ on the left side.       Dorsalis pedis pulses are 2+ on the right side and 2+ on the left side.       Posterior tibial pulses are 2+ on the right side and 2+ on the left side.     Heart sounds: Normal heart sounds.     Comments: No murmurs, rubs or gallops Pulmonary:     Effort: Pulmonary effort is normal. No respiratory distress.     Breath sounds: Normal breath sounds and air entry.     Comments: Clear to auscultation bilateral without wheeze, rhonchi or rales.  No accessory muscle usage. Abdominal:     General: Bowel sounds are normal. There is no distension.     Palpations: Abdomen is soft.     Tenderness: There is no abdominal tenderness.     Comments: Soft, nontender without rebound or guarding  Musculoskeletal:        General: Normal range of motion.     Cervical back: Full passive range of motion without pain, normal range of motion and neck supple.     Comments: Moves all 4 extremities without difficulty.    Skin:    General: Skin is warm and dry.     Capillary Refill: Capillary refill takes less than 2 seconds.     Comments: Brisk capillary refill.  Neurological:     Mental Status: He is alert.     Comments: Mental Status:  Alert, oriented, thought content appropriate. Speech fluent without evidence of aphasia. Able to follow 2 step commands without difficulty.  Cranial Nerves:  II:  Peripheral visual fields grossly normal, pupils equal, round, reactive to light III,IV, VI: ptosis not present, extra-ocular motions intact bilaterally  V,VII: smile symmetric,  facial light touch sensation equal VIII: hearing severely limited by deafness. Can hear mild loud single words IX,X: midline uvula rise  XI: bilateral shoulder shrug equal and strong XII: midline tongue extension  Motor:  5/5 in upper and lower extremities bilaterally including strong and equal grip strength and dorsiflexion/plantar flexion Sensory: Pinprick and light touch normal in all extremities.  Deep Tendon Reflexes: 2+ and symmetric  Cerebellar: normal finger-to-nose with bilateral upper extremities CV: distal pulses palpable throughout      ED Results / Procedures / Treatments   Labs (all labs ordered are listed, but only abnormal results are displayed) Labs Reviewed  URINE CULTURE  CBC WITH DIFFERENTIAL/PLATELET  COMPREHENSIVE METABOLIC PANEL  URINALYSIS, ROUTINE W REFLEX MICROSCOPIC  CBG MONITORING, ED  TROPONIN I (HIGH SENSITIVITY)  TROPONIN I (HIGH SENSITIVITY)    EKG EKG Interpretation  Date/Time:  Thursday July 31 2019 13:43:57 EST Ventricular Rate:  88 PR Interval:    QRS Duration: 83 QT Interval:  357 QTC Calculation: 432 R Axis:   80 Text Interpretation: Sinus rhythm ST elev, probable normal early repol pattern No significant change since last tracing Confirmed by Fredia Sorrow [phone]) on 07/31/2019 1:50:29 PM   Radiology CT Head Wo Contrast  Result Date: 07/31/2019 CLINICAL DATA:  Headache.  Normal neurological exam. Near syncope. 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 EXAM: CT HEAD WITHOUT CONTRAST TECHNIQUE: Contiguous axial images were obtained from the base of the skull through the vertex without intravenous contrast. COMPARISON:  MRI 10/23/2014 FINDINGS: Brain: Cerebellar atrophy as seen previously. No sign of acute infarction, mass lesion, hemorrhage, hydrocephalus or extra-axial collection. 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 Vascular: No abnormal vascular finding. Skull: Normal Sinuses/Orbits: Clear/normal Other: None IMPRESSION: No acute finding by CT. Chronic cerebellar atrophy. Normal CT appearance of the cerebral hemispheres. This is remarkable given the pronounced changes of superficial siderosis demonstrated on the MRI of April 2016. Electronically Signed   By: Nelson Chimes M.D.   On: 07/31/2019 15:30   DG Chest Portable 1 View  Result Date: 07/31/2019 CLINICAL DATA:  Syncope EXAM: PORTABLE CHEST 1 VIEW COMPARISON:  11/05/2016 FINDINGS: The heart size and mediastinal contours are within normal limits. No focal airspace consolidation, pleural effusion, or pneumothorax. The visualized skeletal structures are unremarkable. IMPRESSION: No active disease. Electronically Signed   By: Davina Poke D.O.   On: 07/31/2019 15:01    Procedures Procedures (including critical care time)  Medications Ordered in ED Medications  sodium chloride 0.9 % bolus 1,000 mL (1,000 mLs Intravenous New Bag/Given 07/31/19 1622)   ED Course  I have reviewed the triage vital signs and the nursing notes.  Pertinent labs & imaging results that were available during my care of the patient were reviewed by me and considered in my medical decision making (see chart for details).  43 year old presents for evaluation of near syncope.  Patient with history of spinal tumor.  Patient states he has felt generally  unwell over the last week.  Complete antibiotics for UTI.  Has had Covid-like symptoms with negative outpatient Covid test.  Patient denies preceding sudden onset thunderclap headache, chest pain, shortness of breath or hemoptysis.  He has no tachycardia, tachypnea or hypoxia.  Given IV fluids by EMS.  Patient states he was dizzy prior to that however dizziness resolved with IV fluids.  Has had decreased appetite over the last week.  Heart and lungs clear.  He has a nonfocal neuro exam except for severely limited hearing.  Unfortunately he is only able to read lips and does not understand sign language.  Due to COVID we are having to wear masks.  History is limited due to this factor.  Will obtain baseline labs, head imaging, chest imaging, EKG.  Dr. Rogene Houston attending MD recommends MR brain wo contrast given hx of neuro tumors.   Care transferred to [facility] [place] who will follow up on labs, urine, imaging, orthostatic VS. He will determine ultimate disposition of patient.    MDM Rules/Calculators/A&P                      Donne Meckel was evaluated in Emergency Department on 07/31/2019 for the symptoms described in the history of present illness. He was evaluated in the context of the global COVID-19 pandemic, which necessitated consideration that the patient might be at risk for infection with the SARS-CoV-2 virus that causes COVID-19. Institutional protocols and algorithms that pertain to the evaluation of patients at risk for COVID-19 are in a state of rapid change based on information released by regulatory bodies including the CDC and federal and state organizations. These policies and algorithms were followed during the patient's care in the ED. Final Clinical Impression(s) / ED Diagnoses Final diagnoses:  Syncope, unspecified syncope type    Rx / DC Orders ED Discharge Orders    None       Tekeshia Klahr A, PA-C 07/31/19 1636    Fredia Sorrow, MD 08/15/19 1531

## 2019-07-31 NOTE — ED Notes (Signed)
Patient verbalizes understanding of discharge instructions. Opportunity for questioning and answers were provided. Armband removed by staff, pt discharged from ED ambulatory.   

## 2019-07-31 NOTE — ED Triage Notes (Addendum)
[facility] went to [facility] today for work note bec cause he hAS BEEN FEELING BAD  X 1 week , told  [facility]  That he had positive exposure and tHOUGHT THAT IS WHAT IT WAS , had a slight cough and chills, PT WAS STANDING AT DESK BECAME SYNCOPAL , lightheaded and diaphoretic, ems called pt haas 18 left ac and got 500 fluid pt now aaox4  Had neg covid test  1/5  Pt hx of brain/ spine  tumor  2016 , pt is deaf but read lips and has to cath himself 6  X a day also because of said tumor just came off cipro for  UTI ion sat

## 2019-08-02 LAB — URINE CULTURE: Culture: NO GROWTH

## 2022-07-23 ENCOUNTER — Telehealth: Payer: 59 | Admitting: Family

## 2022-07-23 DIAGNOSIS — J019 Acute sinusitis, unspecified: Secondary | ICD-10-CM | POA: Diagnosis not present

## 2022-07-23 MED ORDER — AMOXICILLIN-POT CLAVULANATE 875-125 MG PO TABS: 1.0000 | ORAL_TABLET | Freq: Two times a day (BID) | ORAL | 0 refills | Status: AC

## 2022-07-23 NOTE — Progress Notes (Signed)
Virtual Visit Consent   Andrew Higgins, you are scheduled for a virtual visit with a [facility] provider today. Just as with appointments in the office, your consent must be obtained to participate. Your consent will be active for this visit and any virtual visit you may have with one of our providers in the next 365 days. If you have a MyChart account, a copy of this consent can be sent to you electronically.  As this is a virtual visit, video technology does not allow for your provider to perform a traditional examination. This may limit your provider's ability to fully assess your condition. If your provider identifies any concerns that need to be evaluated in person or the need to arrange testing (such as labs, EKG, etc.), we will make arrangements to do so. Although advances in technology are sophisticated, we cannot ensure that it will always work on either your end or our end. If the connection with a video visit is poor, the visit may have to be switched to a telephone visit. With either a video or telephone visit, we are not always able to ensure that we have a secure connection.  By engaging in this virtual visit, you consent to the provision of healthcare and authorize for your insurance to be billed (if applicable) for the services provided during this visit. Depending on your insurance coverage, you may receive a charge related to this service.  I need to obtain your verbal consent now. Are you willing to proceed with your visit today? Andrew Higgins has provided verbal consent on 07/23/2022 for a virtual visit (video or telephone). Evelina Dun, FNP  Date: 07/23/2022 7:00 PM  Virtual Visit via Video Note   I, Evelina Dun, connected with  Andrew Higgins  ([id number], Mar 28, 1976) on 07/23/22 at  7:00 PM EST by a video-enabled telemedicine application and verified that I am speaking with the correct person using two identifiers.  Location: Patient: Virtual Visit Location Patient:  Home Provider: Virtual Visit Location Provider: Home Office   I discussed the limitations of evaluation and management by telemedicine and the availability of in person appointments. The patient expressed understanding and agreed to proceed.    History of Present Illness: Andrew Higgins is a 46 y.o. who identifies as a male who was assigned male at birth, and is being seen today for congestion and cough for 2-3 weeks.  HPI: Sinusitis This is a new problem. The current episode started 1 to 4 weeks ago. The problem has been gradually worsening since onset. There has been no fever. His pain is at a severity of 5/10. The pain is moderate. Associated symptoms include congestion, coughing, headaches, sinus pressure and a sore throat. Pertinent negatives include no shortness of breath or sneezing. Past treatments include acetaminophen. The treatment provided mild relief.    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   Hydronephrosis 10/21/2014   Urinary retention 10/21/2014   Sepsis ([place]) 10/21/2014   Renal abscess     Allergies:  Allergies  Allergen Reactions   Phenobarbital Hives   Fish Allergy Swelling and Other (See Comments)    Tongue swelling, lethargy   Medications:  Current Outpatient Medications:    amoxicillin-clavulanate (AUGMENTIN) 875-125 MG tablet, Take 1 tablet by mouth 2 (two) times daily., Disp: 14  tablet, Rfl: 0   acetaminophen (TYLENOL) 500 MG tablet, Take 1 tablet (500 mg total) by mouth every 6 (six) hours as needed., Disp: 30 tablet, Rfl: 0   benzonatate (TESSALON) 100 MG capsule, Take 1 capsule (100 mg total) by mouth every 8 (eight) hours., Disp: 21 capsule, Rfl: 0  Observations/Objective: Patient is well-developed,  well-nourished in no acute distress.  Resting comfortably  at home.  Head is normocephalic, atraumatic.  No labored breathing.  Speech is clear and coherent with logical content.  Patient is alert and oriented at baseline.    Assessment and Plan: 1. Acute sinusitis, recurrence not specified, unspecified location - amoxicillin-clavulanate (AUGMENTIN) 875-125 MG tablet; Take 1 tablet by mouth 2 (two) times daily.  Dispense: 14 tablet; Refill: 0  - Take meds as prescribed - Use a cool mist humidifier  -Use saline nose sprays frequently -Force fluids -For any cough or congestion  Use plain Mucinex- regular strength or max strength is fine -For fever or aces or pains- take tylenol or ibuprofen. -Throat lozenges if help Follow up if symptoms worsen or do not improve   Follow Up Instructions: I discussed the assessment and treatment plan with the patient. The patient was provided an opportunity to ask questions and all were answered. The patient agreed with the plan and demonstrated an understanding of the instructions.  A copy of instructions were sent to the patient via MyChart unless otherwise noted below.     The patient was advised to call back or seek an in-person evaluation if the symptoms worsen or if the condition fails to improve as anticipated.  Time:  I spent 12 minutes with the patient via telehealth technology discussing the above problems/concerns.    Evelina Dun, FNP

## 2022-09-15 ENCOUNTER — Ambulatory Visit
Admission: RE | Admit: 2022-09-15 | Discharge: 2022-09-15 | Disposition: A | Discharge status: Home / Self Care | Payer: 59 | Source: Ambulatory Visit | Attending: Orthopaedic Surgery | Admitting: Orthopaedic Surgery

## 2022-09-15 ENCOUNTER — Other Ambulatory Visit: Payer: Self-pay | Admitting: Orthopaedic Surgery

## 2022-09-15 DIAGNOSIS — M79672 Pain in left foot: Secondary | ICD-10-CM

## 2023-09-03 ENCOUNTER — Other Ambulatory Visit: Payer: Self-pay | Admitting: Chiropractic Medicine

## 2023-09-03 ENCOUNTER — Ambulatory Visit
Admission: RE | Admit: 2023-09-03 | Discharge: 2023-09-03 | Disposition: A | Discharge status: Home / Self Care | Payer: Worker's Compensation | Source: Ambulatory Visit | Attending: Chiropractic Medicine | Admitting: Chiropractic Medicine

## 2023-09-03 DIAGNOSIS — S6991XA Unspecified injury of right wrist, hand and finger(s), initial encounter: Secondary | ICD-10-CM
